# Patient Record
Sex: Female | Born: 1940
Health system: Southern US, Academic
[De-identification: ages and names within clinical notes are randomized; demographics above are authoritative.]

## PROBLEM LIST (undated history)

## (undated) ENCOUNTER — Encounter: Payer: MEDICARE | Attending: Adult Health | Primary: Adult Health

## (undated) ENCOUNTER — Encounter

## (undated) ENCOUNTER — Encounter: Attending: Adult Health | Primary: Adult Health

## (undated) ENCOUNTER — Ambulatory Visit

## (undated) ENCOUNTER — Non-Acute Institutional Stay: Payer: MEDICARE | Attending: Adult Health | Primary: Adult Health

## (undated) ENCOUNTER — Ambulatory Visit: Payer: MEDICARE

## (undated) ENCOUNTER — Telehealth: Attending: Internal Medicine | Primary: Internal Medicine

## (undated) ENCOUNTER — Encounter: Attending: Internal Medicine | Primary: Internal Medicine

## (undated) ENCOUNTER — Non-Acute Institutional Stay: Payer: MEDICARE

## (undated) ENCOUNTER — Telehealth: Attending: Hematology & Oncology | Primary: Hematology & Oncology

## (undated) ENCOUNTER — Encounter: Payer: MEDICARE | Attending: Rheumatology | Primary: Rheumatology

## (undated) ENCOUNTER — Telehealth: Attending: Rheumatology | Primary: Rheumatology

## (undated) ENCOUNTER — Inpatient Hospital Stay

## (undated) ENCOUNTER — Telehealth: Attending: Adult Health | Primary: Adult Health

## (undated) ENCOUNTER — Telehealth: Attending: Oncology | Primary: Oncology

## (undated) ENCOUNTER — Telehealth

## (undated) ENCOUNTER — Ambulatory Visit: Payer: MEDICARE | Attending: Adult Health | Primary: Adult Health

## (undated) ENCOUNTER — Encounter: Attending: Oncology | Primary: Oncology

## (undated) ENCOUNTER — Encounter: Attending: Hematology & Oncology | Primary: Hematology & Oncology

## (undated) DIAGNOSIS — I10 Essential (primary) hypertension: Secondary | ICD-10-CM

## (undated) DIAGNOSIS — M1990 Unspecified osteoarthritis, unspecified site: Secondary | ICD-10-CM

## (undated) DIAGNOSIS — E7800 Pure hypercholesterolemia, unspecified: Secondary | ICD-10-CM

---
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
## 2013-12-27 ENCOUNTER — Ambulatory Visit (HOSPITAL_COMMUNITY)
Admission: RE | Admit: 2013-12-27 | Discharge: 2013-12-27 | Disposition: A | Discharge status: Home / Self Care | Payer: Medicare Other | Source: Ambulatory Visit | Attending: Internal Medicine | Admitting: Internal Medicine

## 2013-12-27 ENCOUNTER — Other Ambulatory Visit (HOSPITAL_COMMUNITY): Payer: Self-pay | Admitting: Internal Medicine

## 2013-12-27 DIAGNOSIS — R9389 Abnormal findings on diagnostic imaging of other specified body structures: Secondary | ICD-10-CM | POA: Insufficient documentation

## 2013-12-27 DIAGNOSIS — M47817 Spondylosis without myelopathy or radiculopathy, lumbosacral region: Secondary | ICD-10-CM | POA: Insufficient documentation

## 2013-12-27 DIAGNOSIS — R1032 Left lower quadrant pain: Secondary | ICD-10-CM | POA: Insufficient documentation

## 2013-12-27 MED ORDER — IOHEXOL 300 MG/ML  SOLN
100.0000 mL | Freq: Once | INTRAMUSCULAR | Status: AC | PRN
  Administered 2013-12-27: 100 mL via INTRAVENOUS

## 2013-12-28 ENCOUNTER — Emergency Department (HOSPITAL_COMMUNITY)
Admission: EM | Admit: 2013-12-28 | Discharge: 2013-12-28 | Disposition: A | Discharge status: Home / Self Care | Payer: Medicare Other | Attending: Emergency Medicine | Admitting: Emergency Medicine

## 2013-12-28 ENCOUNTER — Emergency Department (HOSPITAL_COMMUNITY): Payer: Medicare Other

## 2013-12-28 ENCOUNTER — Encounter (HOSPITAL_COMMUNITY): Payer: Self-pay | Admitting: Emergency Medicine

## 2013-12-28 DIAGNOSIS — Z792 Long term (current) use of antibiotics: Secondary | ICD-10-CM | POA: Insufficient documentation

## 2013-12-28 DIAGNOSIS — R109 Unspecified abdominal pain: Secondary | ICD-10-CM | POA: Insufficient documentation

## 2013-12-28 DIAGNOSIS — M129 Arthropathy, unspecified: Secondary | ICD-10-CM | POA: Insufficient documentation

## 2013-12-28 DIAGNOSIS — Z79899 Other long term (current) drug therapy: Secondary | ICD-10-CM | POA: Insufficient documentation

## 2013-12-28 DIAGNOSIS — F17200 Nicotine dependence, unspecified, uncomplicated: Secondary | ICD-10-CM | POA: Insufficient documentation

## 2013-12-28 DIAGNOSIS — R300 Dysuria: Secondary | ICD-10-CM | POA: Insufficient documentation

## 2013-12-28 DIAGNOSIS — E876 Hypokalemia: Secondary | ICD-10-CM | POA: Insufficient documentation

## 2013-12-28 DIAGNOSIS — R197 Diarrhea, unspecified: Secondary | ICD-10-CM | POA: Insufficient documentation

## 2013-12-28 DIAGNOSIS — M549 Dorsalgia, unspecified: Secondary | ICD-10-CM | POA: Insufficient documentation

## 2013-12-28 DIAGNOSIS — I10 Essential (primary) hypertension: Secondary | ICD-10-CM | POA: Insufficient documentation

## 2013-12-28 DIAGNOSIS — R5381 Other malaise: Secondary | ICD-10-CM | POA: Insufficient documentation

## 2013-12-28 DIAGNOSIS — R5383 Other fatigue: Secondary | ICD-10-CM

## 2013-12-28 DIAGNOSIS — Z791 Long term (current) use of non-steroidal anti-inflammatories (NSAID): Secondary | ICD-10-CM | POA: Insufficient documentation

## 2013-12-28 DIAGNOSIS — R112 Nausea with vomiting, unspecified: Secondary | ICD-10-CM | POA: Insufficient documentation

## 2013-12-28 DIAGNOSIS — E7800 Pure hypercholesterolemia, unspecified: Secondary | ICD-10-CM | POA: Insufficient documentation

## 2013-12-28 HISTORY — DX: Essential (primary) hypertension: I10

## 2013-12-28 HISTORY — DX: Unspecified osteoarthritis, unspecified site: M19.90

## 2013-12-28 HISTORY — DX: Pure hypercholesterolemia, unspecified: E78.00

## 2013-12-28 LAB — CBC WITH DIFFERENTIAL/PLATELET
BASOS PCT: 0 % (ref 0–1)
Basophils Absolute: 0 10*3/uL (ref 0.0–0.1)
Eosinophils Absolute: 0.1 10*3/uL (ref 0.0–0.7)
Eosinophils Relative: 1 % (ref 0–5)
HCT: 28.5 % — ABNORMAL LOW (ref 36.0–46.0)
HEMOGLOBIN: 9.9 g/dL — AB (ref 12.0–15.0)
LYMPHS PCT: 8 % — AB (ref 12–46)
Lymphs Abs: 0.8 10*3/uL (ref 0.7–4.0)
MCH: 32.4 pg (ref 26.0–34.0)
MCHC: 34.7 g/dL (ref 30.0–36.0)
MCV: 93.1 fL (ref 78.0–100.0)
MONOS PCT: 8 % (ref 3–12)
Monocytes Absolute: 0.8 10*3/uL (ref 0.1–1.0)
NEUTROS ABS: 8.8 10*3/uL — AB (ref 1.7–7.7)
Neutrophils Relative %: 83 % — ABNORMAL HIGH (ref 43–77)
Platelets: 254 10*3/uL (ref 150–400)
RBC: 3.06 MIL/uL — ABNORMAL LOW (ref 3.87–5.11)
RDW: 13.7 % (ref 11.5–15.5)
WBC: 10.5 10*3/uL (ref 4.0–10.5)

## 2013-12-28 LAB — COMPREHENSIVE METABOLIC PANEL
ALT: 8 U/L (ref 0–35)
AST: 14 U/L (ref 0–37)
Albumin: 3 g/dL — ABNORMAL LOW (ref 3.5–5.2)
Alkaline Phosphatase: 53 U/L (ref 39–117)
BUN: 12 mg/dL (ref 6–23)
CO2: 23 mEq/L (ref 19–32)
CREATININE: 1.05 mg/dL (ref 0.50–1.10)
Calcium: 11.6 mg/dL — ABNORMAL HIGH (ref 8.4–10.5)
Chloride: 104 mEq/L (ref 96–112)
GFR calc non Af Amer: 52 mL/min — ABNORMAL LOW (ref >90)
GFR, EST AFRICAN AMERICAN: 60 mL/min — AB (ref >90)
GLUCOSE: 113 mg/dL — AB (ref 70–99)
POTASSIUM: 2.7 meq/L — AB (ref 3.7–5.3)
Sodium: 140 mEq/L (ref 137–147)
TOTAL PROTEIN: 9.1 g/dL — AB (ref 6.0–8.3)
Total Bilirubin: 0.3 mg/dL (ref 0.3–1.2)

## 2013-12-28 LAB — URINE MICROSCOPIC-ADD ON

## 2013-12-28 LAB — URINALYSIS, ROUTINE W REFLEX MICROSCOPIC
Bilirubin Urine: NEGATIVE
GLUCOSE, UA: NEGATIVE mg/dL
Hgb urine dipstick: NEGATIVE
Ketones, ur: NEGATIVE mg/dL
Nitrite: NEGATIVE
PH: 5.5 (ref 5.0–8.0)
Protein, ur: 30 mg/dL — AB
Urobilinogen, UA: 0.2 mg/dL (ref 0.0–1.0)

## 2013-12-28 LAB — LIPASE, BLOOD: LIPASE: 24 U/L (ref 11–59)

## 2013-12-28 MED ORDER — HYDROCODONE-ACETAMINOPHEN 5-325 MG PO TABS: 1.0000 | ORAL_TABLET | Freq: Four times a day (QID) | ORAL | Status: AC | PRN

## 2013-12-28 MED ORDER — POTASSIUM CHLORIDE ER 10 MEQ PO TBCR: 10.0000 meq | EXTENDED_RELEASE_TABLET | Freq: Two times a day (BID) | ORAL | Status: AC

## 2013-12-28 MED ORDER — TRAMADOL HCL 50 MG PO TABS: 50.0000 mg | ORAL_TABLET | Freq: Four times a day (QID) | ORAL | Status: AC | PRN

## 2013-12-28 MED ORDER — POTASSIUM CHLORIDE CRYS ER 20 MEQ PO TBCR
40.0000 meq | EXTENDED_RELEASE_TABLET | Freq: Once | ORAL | Status: AC
  Administered 2013-12-28: 40 meq via ORAL
  Filled 2013-12-28: qty 2

## 2013-12-28 MED ORDER — SODIUM CHLORIDE 0.9 % IV BOLUS (SEPSIS)
250.0000 mL | Freq: Once | INTRAVENOUS | Status: AC
  Administered 2013-12-28: 250 mL via INTRAVENOUS

## 2013-12-28 MED ORDER — SODIUM CHLORIDE 0.9 % IV SOLN
INTRAVENOUS | Status: DC
  Administered 2013-12-28: 09:00:00 via INTRAVENOUS

## 2013-12-28 MED ORDER — HYDROMORPHONE HCL PF 1 MG/ML IJ SOLN
0.5000 mg | Freq: Once | INTRAMUSCULAR | Status: AC
  Administered 2013-12-28: 0.5 mg via INTRAVENOUS
  Filled 2013-12-28: qty 1

## 2013-12-28 MED ORDER — ONDANSETRON 4 MG PO TBDP: 4.0000 mg | ORAL_TABLET | Freq: Three times a day (TID) | ORAL | Status: DC | PRN

## 2013-12-28 MED ORDER — ONDANSETRON 4 MG PO TBDP: 4.0000 mg | ORAL_TABLET | Freq: Three times a day (TID) | ORAL | Status: AC | PRN

## 2013-12-28 MED ORDER — POTASSIUM CHLORIDE 10 MEQ/100ML IV SOLN
10.0000 meq | Freq: Once | INTRAVENOUS | Status: AC
  Administered 2013-12-28: 10 meq via INTRAVENOUS
  Filled 2013-12-28: qty 100

## 2013-12-28 MED ORDER — ONDANSETRON HCL 4 MG/2ML IJ SOLN
4.0000 mg | Freq: Once | INTRAMUSCULAR | Status: AC
  Administered 2013-12-28: 4 mg via INTRAVENOUS
  Filled 2013-12-28: qty 2

## 2013-12-28 NOTE — ED Notes (Signed)
Lab called critical K 2.7.  Notified edp 

## 2013-12-28 NOTE — ED Notes (Signed)
Pt requesting more pain medication.  Pt reports pain 8/10.  Notified edp

## 2013-12-28 NOTE — ED Provider Notes (Signed)
CSN: [id number]     Arrival date & time 12/28/13  4098 History   First MD Initiated Contact with Patient 12/28/13 0740     Chief Complaint  Patient presents with  . Abdominal Pain  . Back Pain     (Consider location/radiation/quality/duration/timing/severity/associated sxs/prior Treatment) Patient is a 73 y.o. female presenting with abdominal pain and back pain. The history is provided by the patient.  Abdominal Pain Associated symptoms: diarrhea, dysuria, fatigue, nausea and vomiting   Associated symptoms: no fever and no shortness of breath   Back Pain Associated symptoms: abdominal pain and dysuria   Associated symptoms: no fever and no headaches    patient brought in by her daughter. Patient with complaint of lower abdominal pain nausea vomiting and diarrhea. However is only vomited may be of once or twice a day and and really mostly loose bowel movements. No blood in either. Patient saw her primary care Dr. in the [place] area of last week and was started on 2 antibiotics Cipro and Flagyl patient is still taking those. Patient had CT scan of her abdomen yesterday done as an outpatient they do not know the results. This was done here. Patient also with a complaint of back pain and stiffness made worse with moving. No injury. Patient's past medical history significant for hypertension and arthritis. Patient states that the abdominal pain is 8/10 in the back pain is 8/10. Both started around the same time.  Past Medical History  Diagnosis Date  . Hypertension   . Arthritis   . High cholesterol    History reviewed. No pertinent past surgical history. Family History  Problem Relation Age of Onset  . Cancer Sister   . Cancer Brother    History  Substance Use Topics  . Smoking status: Current Every Day Smoker -- 0.50 packs/day for 58 years    Types: Cigarettes  . Smokeless tobacco: Never Used  . Alcohol Use: No   OB History   Grav Para Term Preterm Abortions TAB SAB Ect Mult  Living                 Review of Systems  Constitutional: Positive for fatigue. Negative for fever.  HENT: Negative for congestion.   Eyes: Negative for redness.  Respiratory: Negative for shortness of breath.   Gastrointestinal: Positive for nausea, vomiting, abdominal pain and diarrhea.  Genitourinary: Positive for dysuria.  Musculoskeletal: Positive for back pain.  Skin: Negative for rash.  Neurological: Negative for headaches.  Hematological: Does not bruise/bleed easily.  Psychiatric/Behavioral: Negative for confusion.      Allergies  Review of patient's allergies indicates no known allergies.  Home Medications   Current Outpatient Rx  Name  Route  Sig  Dispense  Refill  . ciprofloxacin (CIPRO) 500 MG tablet   Oral   Take 1 tablet by mouth 2 (two) times daily. For 7 days         . cloNIDine (CATAPRES) 0.2 MG tablet   Oral   Take 1 tablet by mouth daily.         . fenofibrate micronized (LOFIBRA) 67 MG capsule   Oral   Take 1 capsule by mouth daily.         Marland Kitchen ibuprofen (ADVIL,MOTRIN) 600 MG tablet   Oral   Take 1 tablet by mouth 2 (two) times daily.         . metroNIDAZOLE (FLAGYL) 500 MG tablet   Oral   Take 1 tablet by mouth 3 (three) times  daily. For 7 days         . potassium chloride (K-DUR) 10 MEQ tablet   Oral   Take 1 tablet by mouth 2 (two) times daily.         . sertraline (ZOLOFT) 50 MG tablet   Oral   Take 1 tablet by mouth 2 (two) times daily.         . valsartan-hydrochlorothiazide (DIOVAN-HCT) 320-12.5 MG per tablet   Oral   Take 1 tablet by mouth daily.         . verapamil (CALAN-SR) 240 MG CR tablet   Oral   Take 1 tablet by mouth daily.         Marland Kitchen HYDROcodone-acetaminophen (NORCO/VICODIN) 5-325 MG per tablet   Oral   Take 1-2 tablets by mouth every 6 (six) hours as needed for moderate pain.   15 tablet   0   . potassium chloride (K-DUR) 10 MEQ tablet   Oral   Take 1 tablet (10 mEq total) by mouth 2 (two)  times daily.   6 tablet   0   . traMADol (ULTRAM) 50 MG tablet   Oral   Take 1 tablet (50 mg total) by mouth every 6 (six) hours as needed.   20 tablet   0    BP 148/63  Pulse 63  Temp(Src) 98.5 F (36.9 C) (Oral)  Resp 18  Ht 5\' 7"  (1.702 m)  Wt 140 lb (63.504 kg)  BMI 21.92 kg/m2  SpO2 93% Physical Exam  Nursing note and vitals reviewed. Constitutional: She is oriented to person, place, and time. She appears well-developed and well-nourished. No distress.  HENT:  Head: Normocephalic and atraumatic.  Mouth/Throat: Oropharynx is clear and moist.  Eyes: Conjunctivae and EOM are normal. Pupils are equal, round, and reactive to light.  Neck: Normal range of motion.  Cardiovascular: Normal rate, regular rhythm and normal heart sounds.   No murmur heard. Pulmonary/Chest: Effort normal and breath sounds normal.  Abdominal: Soft. Bowel sounds are normal. There is no tenderness.  Musculoskeletal: Normal range of motion.  Neurological: She is alert and oriented to person, place, and time. No cranial nerve deficit. She exhibits normal muscle tone. Coordination normal.  Skin: Skin is warm. No rash noted.    ED Course  Procedures (including critical care time) Labs Review Labs Reviewed  URINALYSIS, ROUTINE W REFLEX MICROSCOPIC - Abnormal; Notable for the following:    Specific Gravity, Urine >1.030 (*)    Protein, ur 30 (*)    Leukocytes, UA TRACE (*)    All other components within normal limits  COMPREHENSIVE METABOLIC PANEL - Abnormal; Notable for the following:    Potassium 2.7 (*)    Glucose, Bld 113 (*)    Calcium 11.6 (*)    Total Protein 9.1 (*)    Albumin 3.0 (*)    GFR calc non Af Amer 52 (*)    GFR calc Af Amer 60 (*)    All other components within normal limits  CBC WITH DIFFERENTIAL - Abnormal; Notable for the following:    RBC 3.06 (*)    Hemoglobin 9.9 (*)    HCT 28.5 (*)    Neutrophils Relative % 83 (*)    Neutro Abs 8.8 (*)    Lymphocytes Relative 8  (*)    All other components within normal limits  URINE MICROSCOPIC-ADD ON - Abnormal; Notable for the following:    Casts GRANULAR CAST (*)    All other components within  normal limits  LIPASE, BLOOD   Results for orders placed during the hospital encounter of 12/28/13  URINALYSIS, ROUTINE W REFLEX MICROSCOPIC      Result Value Ref Range   Color, Urine YELLOW  YELLOW   APPearance CLEAR  CLEAR   Specific Gravity, Urine >1.030 (*) 1.005 - 1.030   pH 5.5  5.0 - 8.0   Glucose, UA NEGATIVE  NEGATIVE mg/dL   Hgb urine dipstick NEGATIVE  NEGATIVE   Bilirubin Urine NEGATIVE  NEGATIVE   Ketones, ur NEGATIVE  NEGATIVE mg/dL   Protein, ur 30 (*) NEGATIVE mg/dL   Urobilinogen, UA 0.2  0.0 - 1.0 mg/dL   Nitrite NEGATIVE  NEGATIVE   Leukocytes, UA TRACE (*) NEGATIVE  COMPREHENSIVE METABOLIC PANEL      Result Value Ref Range   Sodium 140  137 - 147 mEq/L   Potassium 2.7 (*) 3.7 - 5.3 mEq/L   Chloride 104  96 - 112 mEq/L   CO2 23  19 - 32 mEq/L   Glucose, Bld 113 (*) 70 - 99 mg/dL   BUN 12  6 - 23 mg/dL   Creatinine, Ser 0.98  0.50 - 1.10 mg/dL   Calcium 11.9 (*) 8.4 - 10.5 mg/dL   Total Protein 9.1 (*) 6.0 - 8.3 g/dL   Albumin 3.0 (*) 3.5 - 5.2 g/dL   AST 14  0 - 37 U/L   ALT 8  0 - 35 U/L   Alkaline Phosphatase 53  39 - 117 U/L   Total Bilirubin 0.3  0.3 - 1.2 mg/dL   GFR calc non Af Amer 52 (*) >90 mL/min   GFR calc Af Amer 60 (*) >90 mL/min  LIPASE, BLOOD      Result Value Ref Range   Lipase 24  11 - 59 U/L  CBC WITH DIFFERENTIAL      Result Value Ref Range   WBC 10.5  4.0 - 10.5 K/uL   RBC 3.06 (*) 3.87 - 5.11 MIL/uL   Hemoglobin 9.9 (*) 12.0 - 15.0 g/dL   HCT 14.7 (*) 82.9 - 56.2 %   MCV 93.1  78.0 - 100.0 fL   MCH 32.4  26.0 - 34.0 pg   MCHC 34.7  30.0 - 36.0 g/dL   RDW 13.0  86.5 - 78.4 %   Platelets 254  150 - 400 K/uL   Neutrophils Relative % 83 (*) 43 - 77 %   Neutro Abs 8.8 (*) 1.7 - 7.7 K/uL   Lymphocytes Relative 8 (*) 12 - 46 %   Lymphs Abs 0.8  0.7 - 4.0 K/uL    Monocytes Relative 8  3 - 12 %   Monocytes Absolute 0.8  0.1 - 1.0 K/uL   Eosinophils Relative 1  0 - 5 %   Eosinophils Absolute 0.1  0.0 - 0.7 K/uL   Basophils Relative 0  0 - 1 %   Basophils Absolute 0.0  0.0 - 0.1 K/uL  URINE MICROSCOPIC-ADD ON      Result Value Ref Range   WBC, UA 3-6  <3 WBC/hpf   Casts GRANULAR CAST (*) NEGATIVE    Imaging Review Dg Chest 2 View  12/28/2013   CLINICAL DATA Pain and hypertension  EXAM CHEST  2 VIEW  COMPARISON None.  FINDINGS There is minimal scarring in the left base. Lungs are otherwise clear. Heart is mildly enlarged with normal pulmonary vascularity. No adenopathy. No bone lesions.  IMPRESSION No edema or consolidation. Mild scarring left base. Heart  mildly enlarged.  SIGNATURE  Electronically Signed   By: Bretta BangWilliam  Woodruff M.D.   On: 12/28/2013 09:29   Ct Abdomen Pelvis W Contrast  12/27/2013   CLINICAL DATA:  Left lower quadrant pain for 3 weeks. Question ascites.  EXAM: CT ABDOMEN AND PELVIS WITH CONTRAST  TECHNIQUE: Multidetector CT imaging of the abdomen and pelvis was performed using the standard protocol following bolus administration of intravenous contrast.  CONTRAST:  100mL OMNIPAQUE IOHEXOL 300 MG/ML  SOLN  COMPARISON:  None.  FINDINGS: Lower Chest: Mild scarring at the left lung base. Moderate cardiomegaly, without pericardial or pleural effusion.  Abdomen/Pelvis: Normal all liver, spleen, stomach, pancreas, gallbladder, biliary tract, adrenal glands. Right renal cysts. Subtle heterogeneous enhancement of the left kidney suspected. Example images 18 and 20 of delayed series 7.  Aortic and branch vessel atherosclerosis. No retroperitoneal or retrocrural adenopathy. Scattered colonic diverticula. Normal terminal ileum. Minimal motion degradation. Normal small bowel without abdominal ascites. No pelvic adenopathy. Decompressed urinary bladder. Mild pelvic floor laxity. Uterine calcifications which could represent small underlying fibroids or be  dystrophic. An exophytic fibroid is identified at the fundus at 8 mm on sagittal image 53.  No adnexal mass or significant free fluid.  Bones/Musculoskeletal: Mild osteopenia. A mild to moderate inferior endplate compression deformity at L1. Minimal ventral canal encroachment inferiorly. Degenerative disc disease at L3-4. Multilevel disc bulges.  IMPRESSION: 1. Equivocal heterogeneous enhancement involving the left kidney on delayed images. Cannot exclude Mild pyelonephritis. Correlate with urinalysis. 2. Osteopenia with spondylosis and a mild to moderate L1 compression deformity. 3. No other explanation for left-sided pain. 4. Small volume uterine fibroid or fibroids. 5. Pelvic floor laxity. 6. Mild motion degradation.   Electronically Signed   By: Jeronimo GreavesKyle  Talbot M.D.   On: 12/27/2013 12:03     EKG Interpretation   Date/Time:  Wednesday December 28 2013 09:50:00 EDT Ventricular Rate:  63 PR Interval:  178 QRS Duration: 106 QT Interval:  440 QTC Calculation: 450 R Axis:   -20 Text Interpretation:  Normal sinus rhythm Moderate voltage criteria for  LVH, may be normal variant Nonspecific T wave abnormality Abnormal ECG No  previous ECGs available Confirmed by Ayo Smoak  MD, Herman Fiero ([id number]) on  12/28/2013 10:09:22 AM      MDM   Final diagnoses:  Abdominal pain  Hypokalemia  Back pain    Patient already on Cipro and Flagyl started by her primary care Dr. presumably for her. Treatment for possible diverticulitis. CT scan negative. Only significant finding here today was the finding of hypokalemia. Patient treated with a total of 20 mEq of potassium IV. Also given 40 mEq of potassium by mouth. Patient will continue potassium supplements for the next 3 days. Patient has followup with her doctor on Friday potassium can be rechecked. No distinct explanation for the abdominal pain. No evidence of diverticulitis. No evidence of any other acute intra-abdominal process. We'll treat with pain medicine. Also  we'll treat with antinausea medicine. Suspect that the back pain complaint is of musculoskeletal in nature.    Shelda JakesScott W. Greenlee Ancheta, MD 12/28/13 [phone]

## 2013-12-28 NOTE — ED Notes (Signed)
Patient c/o lower abd pain with nausea vomiting, and diarrhea. Per family member patient seen by PCP last week and given 2 antibiotics (unsure of the names or diagnosis). Per family member patient had CT of abd and pelvis. Patient denies any improvement. Patient also c/o lower back pain.

## 2013-12-28 NOTE — Discharge Instructions (Signed)
Followup with your Dr. in [place] is scheduled for Friday. He'll be important to have your potassium rechecked. Take the tramadol as needed for pain. Can supplement with hydrocodone as needed for more severe pain. Take the Zofran as needed for any nausea or are vomiting. Your potassium was low today. You need to continue taking potassium pills for the next 3 days. Return for any new or worse symptoms.

## 2015-09-09 IMAGING — CT CT ABD-PELV W/ CM
2 of 4 series · 16 of 46 positions shown, 18 images · IV contrast (Omnipaque 300)
Comparison: None.

CLINICAL DATA: Left lower quadrant pain for 3 weeks. Question
ascites.

EXAM:
CT ABDOMEN AND PELVIS WITH CONTRAST
TECHNIQUE: Multidetector CT imaging of the abdomen and pelvis was performed
using the standard protocol following bolus administration of
intravenous contrast.
CONTRAST:  100mL OMNIPAQUE IOHEXOL 300 MG/ML  SOLN

[Series 2: abd_pel_with 5.0 b40f · axial · 0.66mm/px · z∈[-408,-28]mm · 13 of 84 slices shown, 15 images]
[im 4/84  soft-tissue]
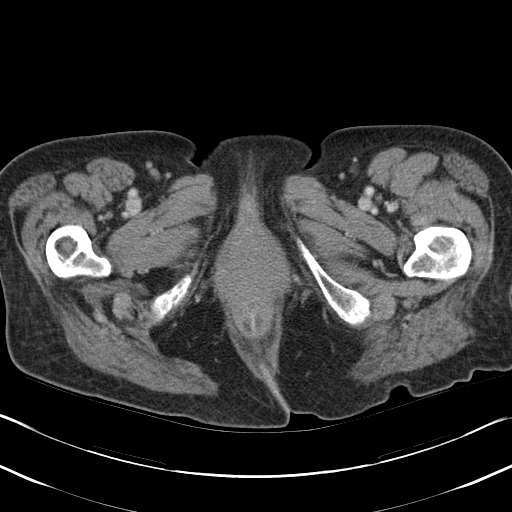
[im 4/84  bone]
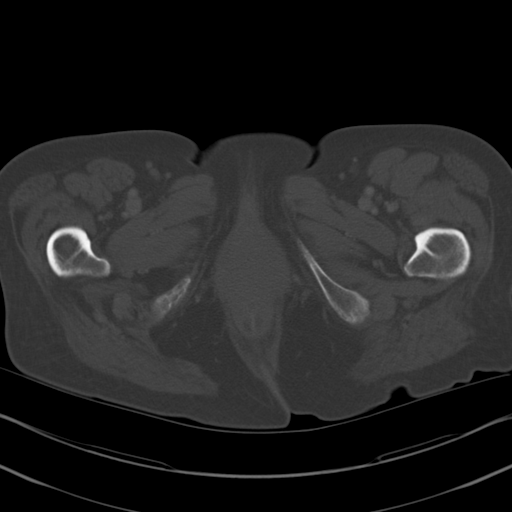
[im 12/84  soft-tissue]
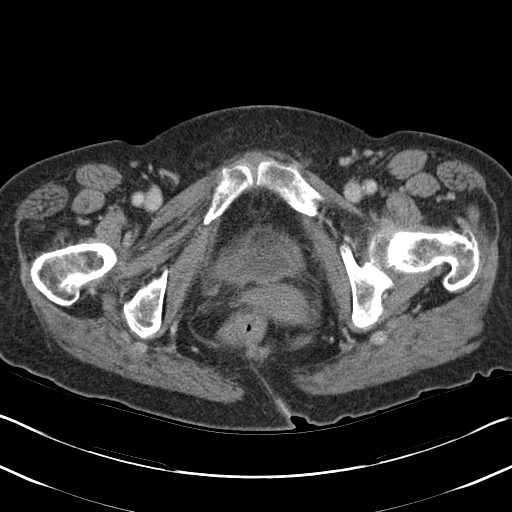
[im 16/84  soft-tissue]
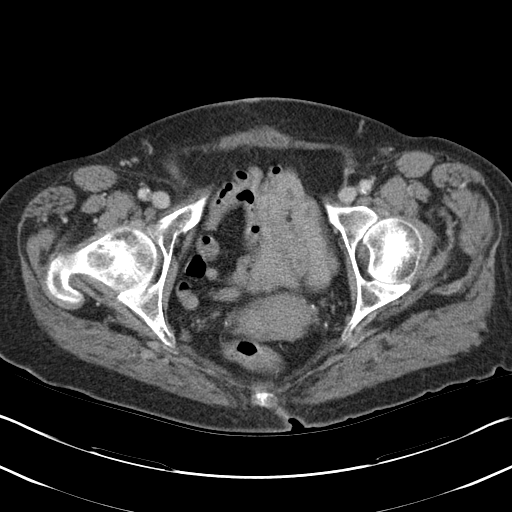
[im 24/84  soft-tissue]
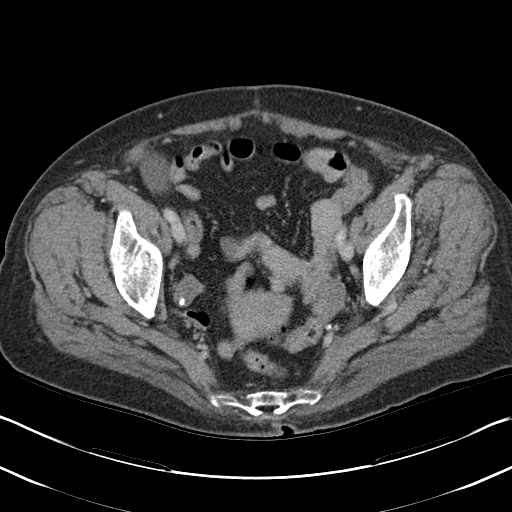
[im 28/84  soft-tissue]
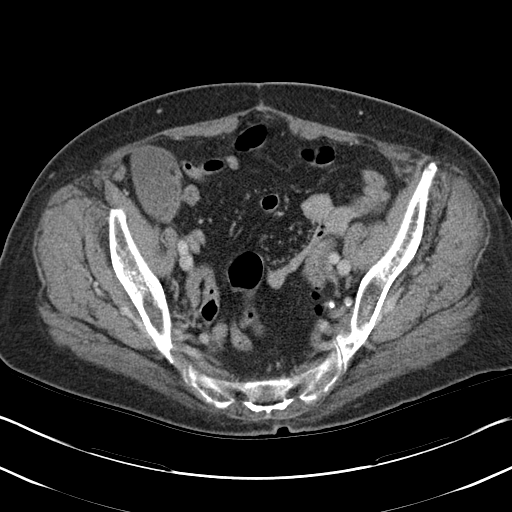
[im 36/84  soft-tissue]
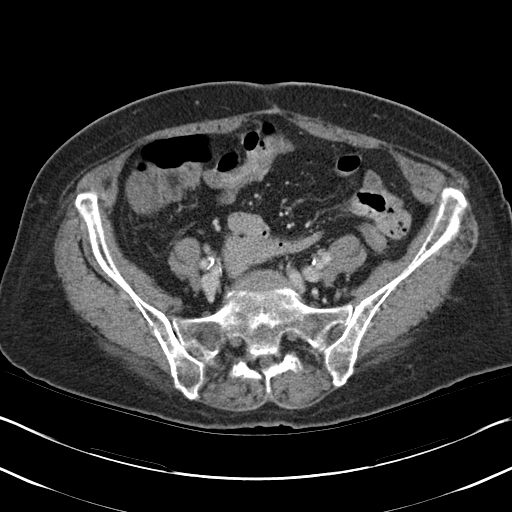
[im 44/84  soft-tissue]
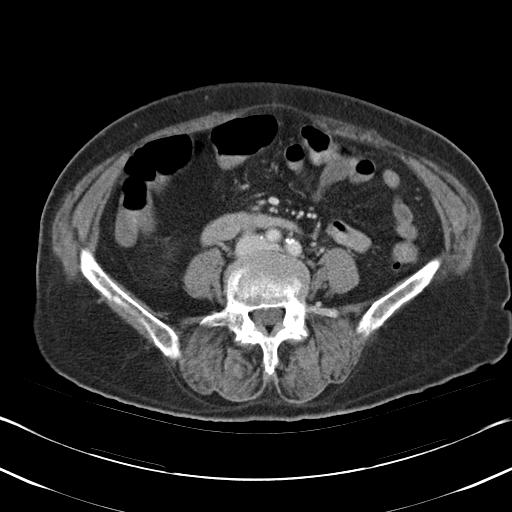
[im 48/84  soft-tissue]
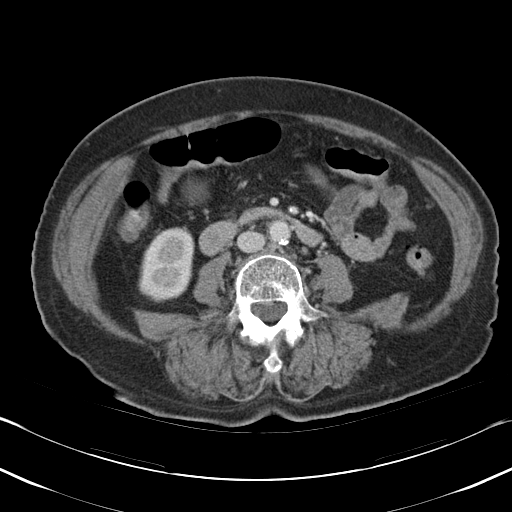
[im 56/84  soft-tissue]
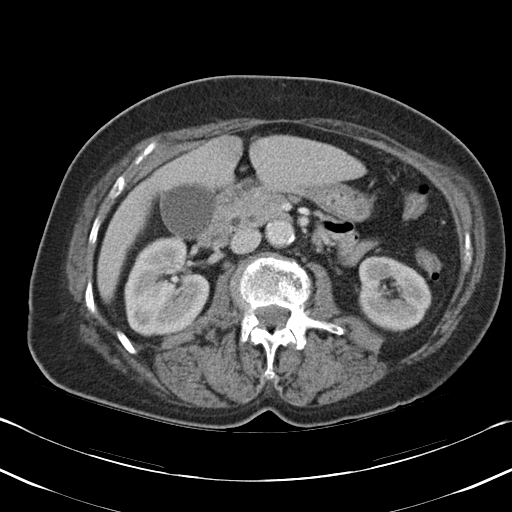
[im 56/84  bone]
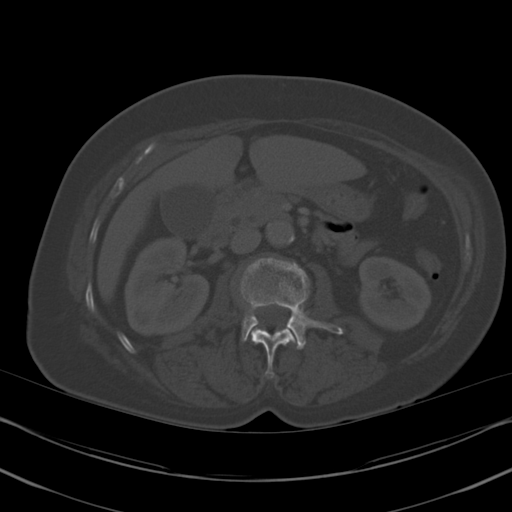
[im 60/84  soft-tissue]
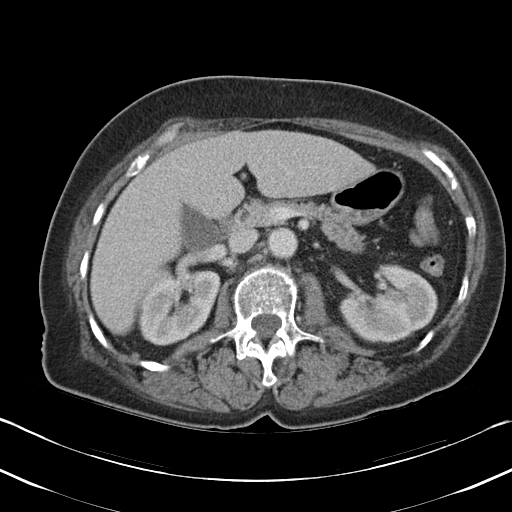
[im 68/84  soft-tissue]
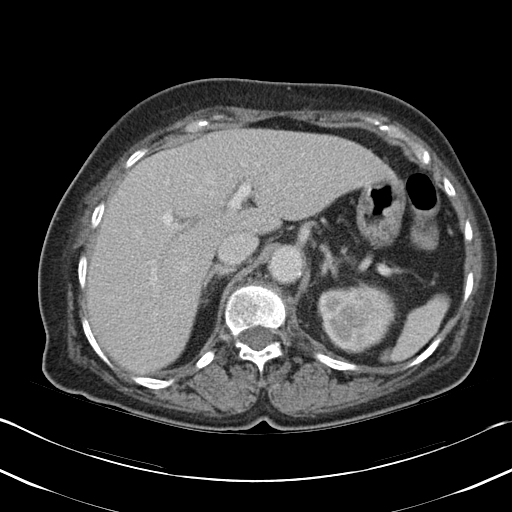
[im 72/84  soft-tissue]
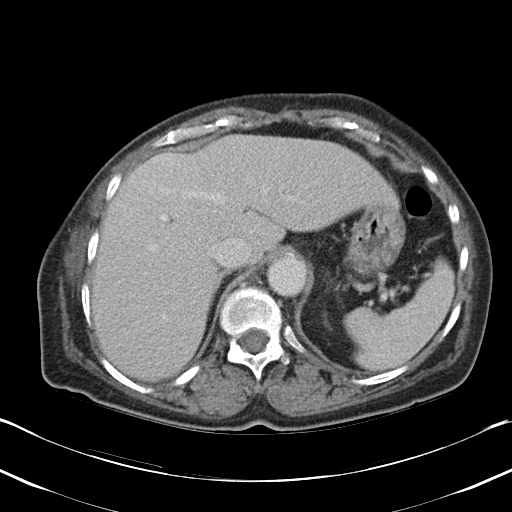
[im 80/84  soft-tissue]
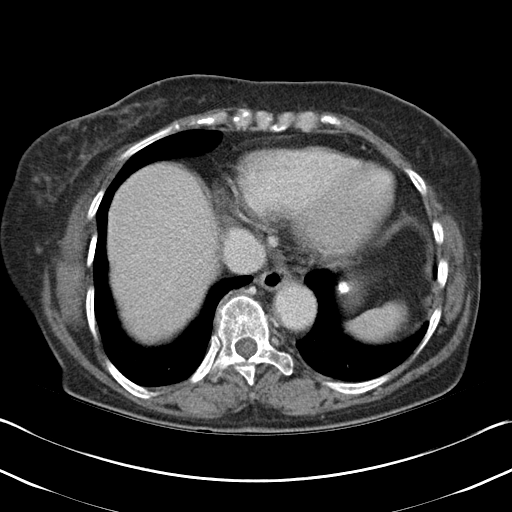

[Series 4: abd_pel_with 3.0 spo cor · coronal · 0.64mm/px · 3 of 76 slices shown]
[im 26/76  soft-tissue]
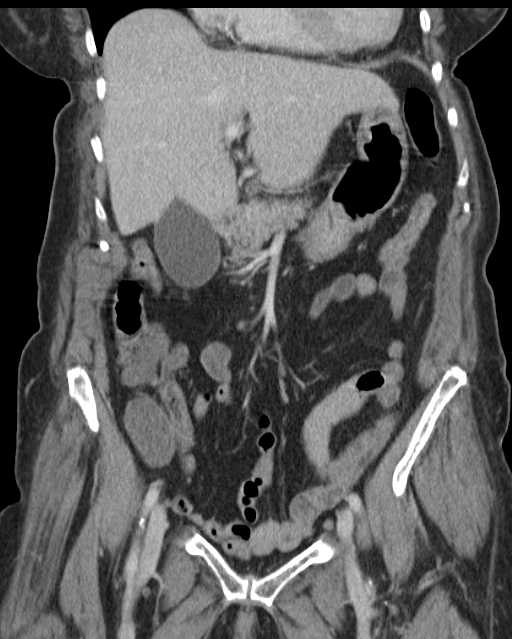
[im 34/76  soft-tissue]
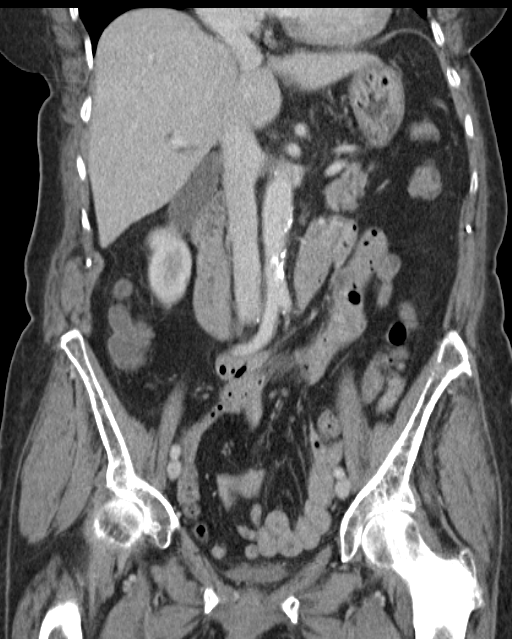
[im 42/76  soft-tissue]
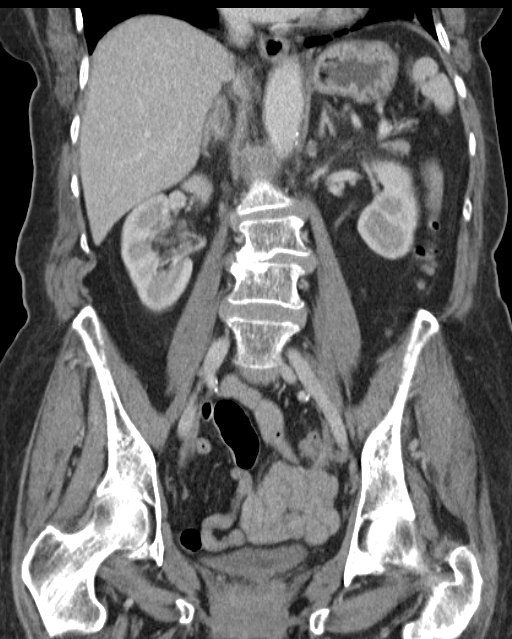

[16 of 46 positions shown; findings below may reference images not displayed]

FINDINGS: Lower Chest: Mild scarring at the left lung base. Moderate
cardiomegaly, without pericardial or pleural effusion.

Abdomen/Pelvis: Normal all liver, spleen, stomach, pancreas,
gallbladder, biliary tract, adrenal glands. Right renal cysts.
Subtle heterogeneous enhancement of the left kidney suspected.
Example images 18 and 20 of delayed series 7.

Aortic and branch vessel atherosclerosis. No retroperitoneal or
retrocrural adenopathy. Scattered colonic diverticula. Normal
terminal ileum. Minimal motion degradation. Normal small bowel
without abdominal ascites. No pelvic adenopathy. Decompressed
urinary bladder. Mild pelvic floor laxity. Uterine calcifications
which could represent small underlying fibroids or be dystrophic. An
exophytic fibroid is identified at the fundus at 8 mm on sagittal
image 53.

No adnexal mass or significant free fluid.

Bones/Musculoskeletal: Mild osteopenia. A mild to moderate inferior
endplate compression deformity at L1. Minimal ventral canal
encroachment inferiorly. Degenerative disc disease at L3-4.
Multilevel disc bulges.
IMPRESSION: 1. Equivocal heterogeneous enhancement involving the left kidney on
delayed images. Cannot exclude Mild pyelonephritis. Correlate with
urinalysis.
2. Osteopenia with spondylosis and a mild to moderate L1 compression
deformity.
3. No other explanation for left-sided pain.
4. Small volume uterine fibroid or fibroids.
5. Pelvic floor laxity.
6. Mild motion degradation.

## 2015-09-10 IMAGING — CR DG CHEST 2V
2 series · 2 of 2 positions shown · non-contrast
Comparison: none

[view not recorded (1 of 2)]
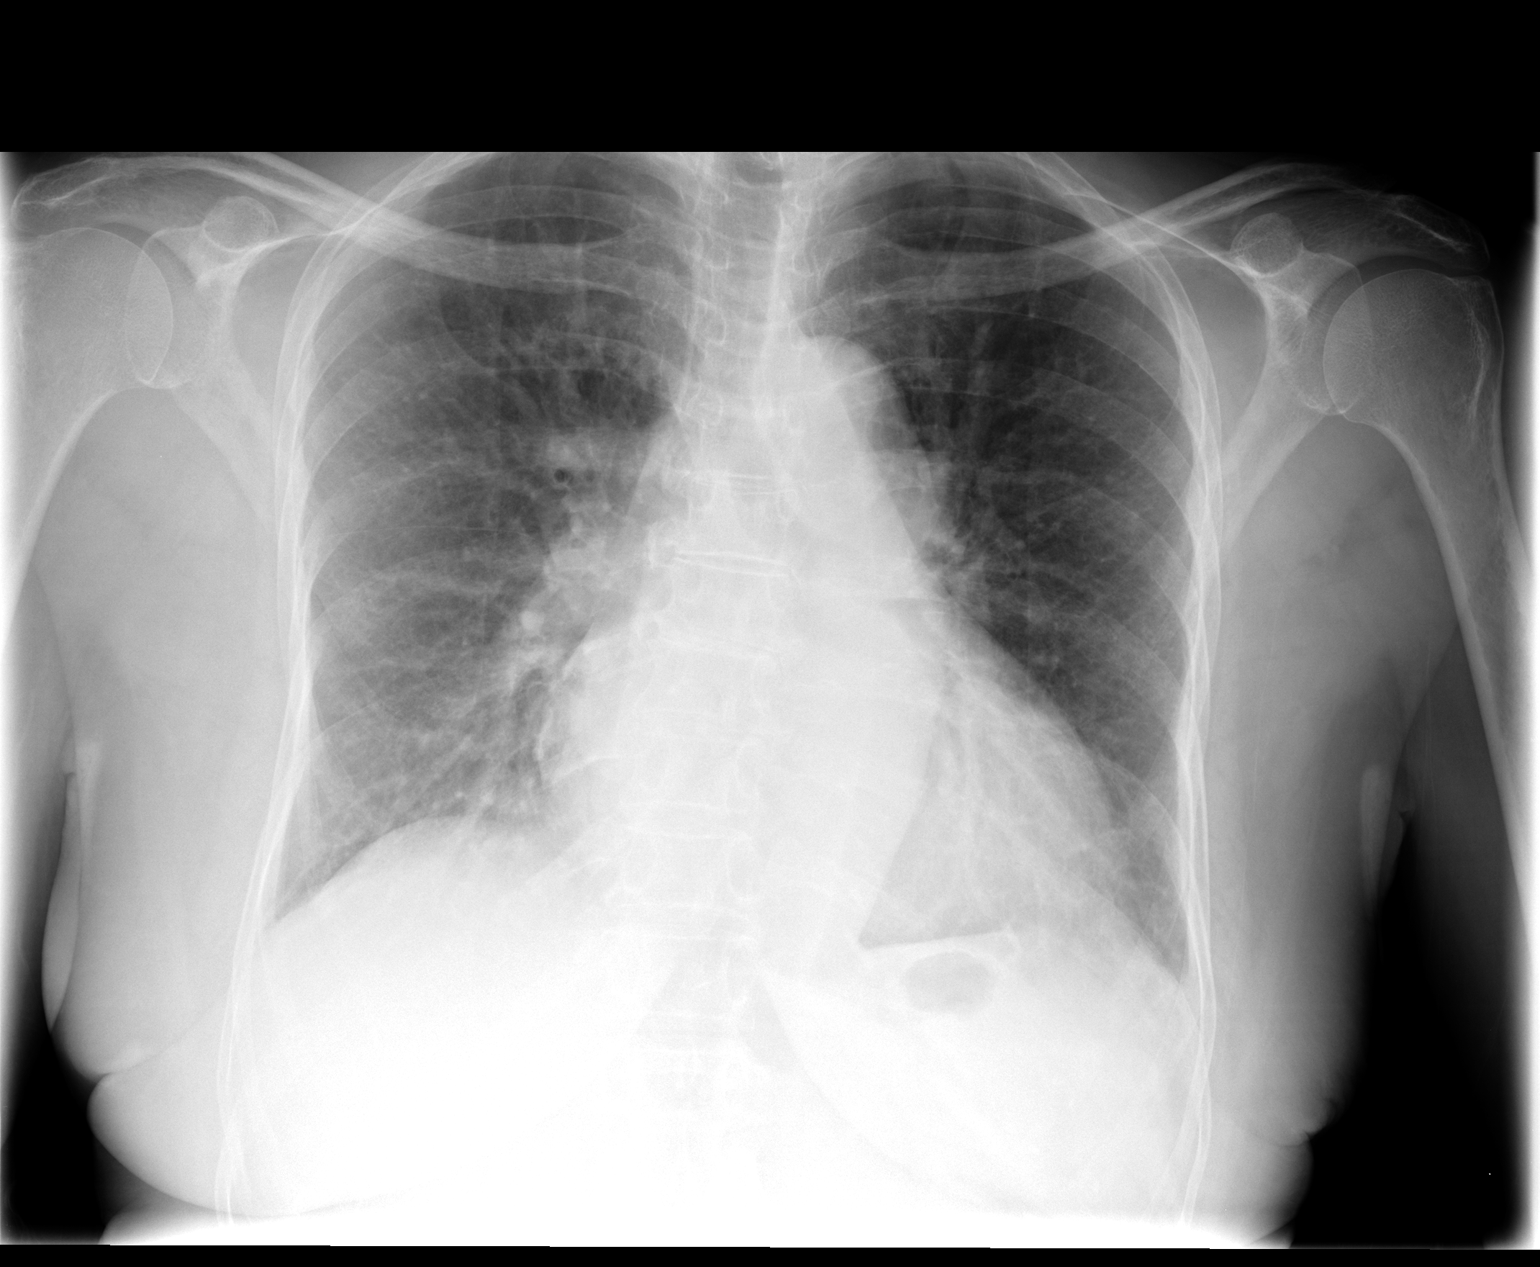

[view not recorded (2 of 2)]
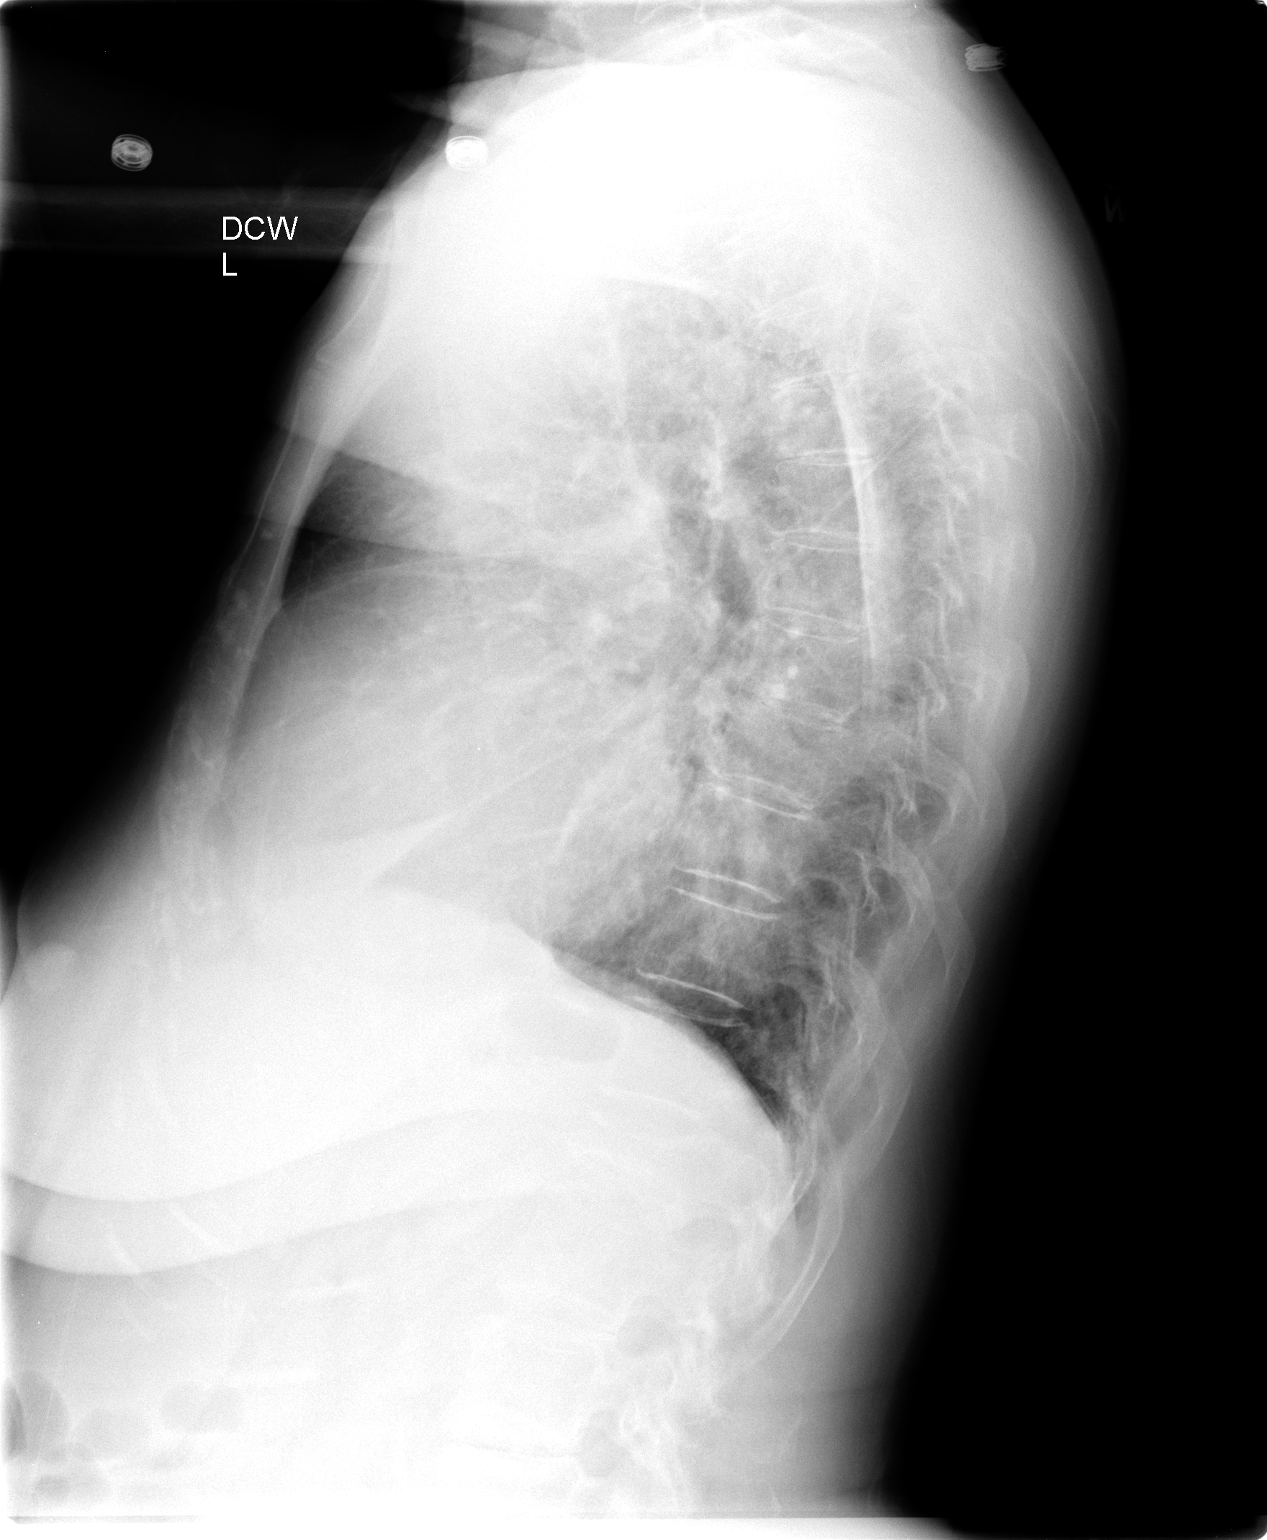

[2 of 2 positions shown; findings below may reference images not displayed]

CLINICAL DATA
Pain and hypertension

EXAM
CHEST  2 VIEW

COMPARISON
None.

FINDINGS
There is minimal scarring in the left base. Lungs are otherwise
clear. Heart is mildly enlarged with normal pulmonary vascularity.
No adenopathy. No bone lesions.

IMPRESSION
No edema or consolidation. Mild scarring left base. Heart mildly
enlarged.

SIGNATURE

## 2017-04-28 MED ORDER — POMALYST 2 MG CAPSULE
ORAL_CAPSULE | Freq: Every day | ORAL | 0 refills | 0.00000 days | Status: CP
Start: 2017-04-28 — End: 2017-06-02

## 2017-05-06 ENCOUNTER — Ambulatory Visit
Admission: RE | Admit: 2017-05-06 | Discharge: 2017-05-06 | Disposition: A | Discharge status: Home / Self Care | Payer: MEDICARE

## 2017-05-06 ENCOUNTER — Ambulatory Visit
Admission: RE | Admit: 2017-05-06 | Discharge: 2017-05-06 | Disposition: A | Discharge status: Home / Self Care | Payer: MEDICARE | Attending: Adult Health | Admitting: Adult Health

## 2017-05-06 DIAGNOSIS — C9002 Multiple myeloma in relapse: Principal | ICD-10-CM

## 2017-05-06 DIAGNOSIS — C9000 Multiple myeloma not having achieved remission: Principal | ICD-10-CM

## 2017-05-14 MED ORDER — DEXAMETHASONE 4 MG TABLET
ORAL_TABLET | 1 refills | 0 days | Status: CP
Start: 2017-05-14 — End: 2017-07-10

## 2017-06-02 MED ORDER — POMALYST 2 MG CAPSULE
ORAL_CAPSULE | Freq: Every day | ORAL | 0 refills | 0 days | Status: CP
Start: 2017-06-02 — End: 2017-06-25

## 2017-06-03 ENCOUNTER — Ambulatory Visit
Admission: RE | Admit: 2017-06-03 | Discharge: 2017-06-03 | Disposition: A | Discharge status: Home / Self Care | Payer: MEDICARE

## 2017-06-03 ENCOUNTER — Ambulatory Visit
Admission: RE | Admit: 2017-06-03 | Discharge: 2017-06-03 | Disposition: A | Discharge status: Home / Self Care | Attending: Adult Health | Admitting: Adult Health

## 2017-06-03 DIAGNOSIS — G620 Drug-induced polyneuropathy: Secondary | ICD-10-CM

## 2017-06-03 DIAGNOSIS — T451X5A Adverse effect of antineoplastic and immunosuppressive drugs, initial encounter: Secondary | ICD-10-CM

## 2017-06-03 DIAGNOSIS — C9002 Multiple myeloma in relapse: Principal | ICD-10-CM

## 2017-06-03 DIAGNOSIS — C9000 Multiple myeloma not having achieved remission: Secondary | ICD-10-CM

## 2017-06-03 MED ORDER — MAGNESIUM OXIDE 400 MG (241.3 MG MAGNESIUM) TABLET
ORAL_TABLET | Freq: Two times a day (BID) | ORAL | 2 refills | 0 days | Status: CP
Start: 2017-06-03 — End: 2018-02-24

## 2017-06-25 MED ORDER — POMALYST 2 MG CAPSULE
ORAL_CAPSULE | Freq: Every day | ORAL | 0 refills | 0 days | Status: CP
Start: 2017-06-25 — End: 2017-07-28

## 2017-07-01 ENCOUNTER — Ambulatory Visit
Admission: RE | Admit: 2017-07-01 | Discharge: 2017-07-01 | Disposition: A | Discharge status: Home / Self Care | Attending: Adult Health | Admitting: Adult Health

## 2017-07-01 ENCOUNTER — Ambulatory Visit: Admission: RE | Admit: 2017-07-01 | Discharge: 2017-07-01 | Disposition: A | Discharge status: Home / Self Care

## 2017-07-01 DIAGNOSIS — C9002 Multiple myeloma in relapse: Principal | ICD-10-CM

## 2017-07-01 DIAGNOSIS — R630 Anorexia: Secondary | ICD-10-CM

## 2017-07-01 DIAGNOSIS — G4700 Insomnia, unspecified: Secondary | ICD-10-CM

## 2017-07-01 DIAGNOSIS — C9000 Multiple myeloma not having achieved remission: Secondary | ICD-10-CM

## 2017-07-01 DIAGNOSIS — R432 Parageusia: Principal | ICD-10-CM

## 2017-07-01 MED ORDER — ZINC GLUCONATE 100 MG TABLET
Freq: Every day | ORAL | 11 refills | 0 days | Status: CP
Start: 2017-07-01 — End: 2018-04-21

## 2017-07-01 MED ORDER — MIRTAZAPINE 45 MG TABLET
ORAL_TABLET | Freq: Every evening | ORAL | 6 refills | 0.00000 days | Status: CP
Start: 2017-07-01 — End: 2018-02-23

## 2017-07-10 MED ORDER — DEXAMETHASONE 4 MG TABLET
ORAL_TABLET | 2 refills | 0 days | Status: CP
Start: 2017-07-10 — End: 2017-11-03

## 2017-07-28 MED ORDER — POMALYST 2 MG CAPSULE
ORAL_CAPSULE | Freq: Every day | ORAL | 0 refills | 0 days | Status: CP
Start: 2017-07-28 — End: 2017-08-20

## 2017-07-29 ENCOUNTER — Ambulatory Visit
Admission: RE | Admit: 2017-07-29 | Discharge: 2017-07-29 | Disposition: A | Discharge status: Home / Self Care | Payer: MEDICARE | Attending: Oncology | Admitting: Oncology

## 2017-07-29 ENCOUNTER — Ambulatory Visit
Admission: RE | Admit: 2017-07-29 | Discharge: 2017-07-29 | Disposition: A | Discharge status: Home / Self Care | Payer: MEDICARE

## 2017-07-29 ENCOUNTER — Ambulatory Visit: Admission: RE | Admit: 2017-07-29 | Discharge: 2017-07-29 | Disposition: A | Discharge status: Home / Self Care

## 2017-07-29 DIAGNOSIS — C9000 Multiple myeloma not having achieved remission: Principal | ICD-10-CM

## 2017-07-29 DIAGNOSIS — C9002 Multiple myeloma in relapse: Principal | ICD-10-CM

## 2017-08-17 MED ORDER — POTASSIUM CHLORIDE ER 10 MEQ TABLET, EXTENDED RELEASE WRAPPER
ORAL_TABLET | Freq: Every day | ORAL | 11 refills | 0 days | Status: CP
Start: 2017-08-17 — End: 2017-08-18

## 2017-08-18 MED ORDER — POTASSIUM CHLORIDE ER 10 MEQ TABLET, EXTENDED RELEASE WRAPPER
ORAL_TABLET | Freq: Every day | ORAL | 11 refills | 0.00000 days | Status: CP
Start: 2017-08-18 — End: 2018-04-21

## 2017-08-20 MED ORDER — POMALYST 2 MG CAPSULE
ORAL_CAPSULE | Freq: Every day | ORAL | 0 refills | 0 days | Status: CP
Start: 2017-08-20 — End: 2017-09-14

## 2017-08-24 ENCOUNTER — Ambulatory Visit
Admission: RE | Admit: 2017-08-24 | Discharge: 2017-08-24 | Disposition: A | Discharge status: Home / Self Care | Payer: MEDICARE

## 2017-08-24 ENCOUNTER — Ambulatory Visit: Admission: RE | Admit: 2017-08-24 | Discharge: 2017-08-24 | Disposition: A | Discharge status: Home / Self Care

## 2017-08-24 ENCOUNTER — Ambulatory Visit
Admission: RE | Admit: 2017-08-24 | Discharge: 2017-08-24 | Disposition: A | Discharge status: Home / Self Care | Payer: MEDICARE | Attending: Adult Health | Admitting: Adult Health

## 2017-08-24 DIAGNOSIS — G620 Drug-induced polyneuropathy: Secondary | ICD-10-CM

## 2017-08-24 DIAGNOSIS — C9000 Multiple myeloma not having achieved remission: Secondary | ICD-10-CM

## 2017-08-24 DIAGNOSIS — Z23 Encounter for immunization: Secondary | ICD-10-CM

## 2017-08-24 DIAGNOSIS — C9002 Multiple myeloma in relapse: Principal | ICD-10-CM

## 2017-08-24 DIAGNOSIS — T451X5A Adverse effect of antineoplastic and immunosuppressive drugs, initial encounter: Secondary | ICD-10-CM

## 2017-08-24 DIAGNOSIS — N183 Chronic kidney disease, stage 3 (moderate): Secondary | ICD-10-CM

## 2017-09-14 MED ORDER — POMALYST 2 MG CAPSULE
ORAL_CAPSULE | Freq: Every day | ORAL | 0 refills | 0 days | Status: CP
Start: 2017-09-14 — End: 2017-10-16

## 2017-09-25 ENCOUNTER — Ambulatory Visit
Admission: RE | Admit: 2017-09-25 | Discharge: 2017-09-25 | Disposition: A | Discharge status: Home / Self Care | Payer: MEDICARE | Attending: Adult Health | Admitting: Adult Health

## 2017-09-25 ENCOUNTER — Ambulatory Visit
Admission: RE | Admit: 2017-09-25 | Discharge: 2017-09-25 | Disposition: A | Discharge status: Home / Self Care | Payer: MEDICARE

## 2017-09-25 DIAGNOSIS — N183 Chronic kidney disease, stage 3 (moderate): Secondary | ICD-10-CM

## 2017-09-25 DIAGNOSIS — C9002 Multiple myeloma in relapse: Principal | ICD-10-CM

## 2017-09-25 DIAGNOSIS — G620 Drug-induced polyneuropathy: Secondary | ICD-10-CM

## 2017-09-25 DIAGNOSIS — T451X5A Adverse effect of antineoplastic and immunosuppressive drugs, initial encounter: Secondary | ICD-10-CM

## 2017-10-09 ENCOUNTER — Emergency Department
Admission: EM | Admit: 2017-10-09 | Discharge: 2017-10-09 | Disposition: A | Discharge status: Home / Self Care | Payer: MEDICARE | Source: Intra-hospital

## 2017-10-09 ENCOUNTER — Emergency Department
Admission: EM | Admit: 2017-10-09 | Discharge: 2017-10-09 | Disposition: A | Discharge status: Home / Self Care | Payer: MEDICARE | Source: Intra-hospital | Attending: Emergency Medicine

## 2017-10-09 DIAGNOSIS — C9002 Multiple myeloma in relapse: Principal | ICD-10-CM

## 2017-10-15 ENCOUNTER — Ambulatory Visit
Admission: RE | Admit: 2017-10-15 | Discharge: 2017-10-15 | Disposition: A | Discharge status: Home / Self Care | Payer: MEDICARE

## 2017-10-15 DIAGNOSIS — C9002 Multiple myeloma in relapse: Secondary | ICD-10-CM

## 2017-10-15 DIAGNOSIS — C9000 Multiple myeloma not having achieved remission: Principal | ICD-10-CM

## 2017-10-16 MED ORDER — POMALYST 2 MG CAPSULE
ORAL_CAPSULE | Freq: Every day | ORAL | 0 refills | 0 days | Status: CP
Start: 2017-10-16 — End: 2017-11-03

## 2017-10-20 ENCOUNTER — Ambulatory Visit
Admit: 2017-10-20 | Discharge: 2017-11-03 | Disposition: A | Discharge status: Skilled Nursing Facility | Payer: MEDICARE

## 2017-10-20 ENCOUNTER — Encounter
Admit: 2017-10-20 | Discharge: 2017-11-03 | Disposition: A | Discharge status: Skilled Nursing Facility | Payer: MEDICARE | Attending: Student in an Organized Health Care Education/Training Program

## 2017-10-20 DIAGNOSIS — I96 Gangrene, not elsewhere classified: Principal | ICD-10-CM

## 2017-10-21 DIAGNOSIS — I96 Gangrene, not elsewhere classified: Principal | ICD-10-CM

## 2017-10-22 DIAGNOSIS — I96 Gangrene, not elsewhere classified: Principal | ICD-10-CM

## 2017-10-25 DIAGNOSIS — I96 Gangrene, not elsewhere classified: Principal | ICD-10-CM

## 2017-11-03 MED ORDER — SENNOSIDES 8.6 MG TABLET
ORAL_TABLET | Freq: Every evening | ORAL | 0 refills | 0 days | PRN
Start: 2017-11-03 — End: 2018-04-16

## 2017-11-03 MED ORDER — POLYETHYLENE GLYCOL 3350 17 GRAM ORAL POWDER PACKET
PACK | Freq: Every day | ORAL | 0 refills | 0.00000 days | PRN
Start: 2017-11-03 — End: ?

## 2017-11-03 MED ORDER — OXYCODONE 5 MG TABLET
ORAL_TABLET | ORAL | 0 refills | 0 days | Status: CP | PRN
Start: 2017-11-03 — End: 2018-01-27

## 2017-11-04 MED ORDER — CLOPIDOGREL 75 MG TABLET
ORAL_TABLET | Freq: Every day | ORAL | 0 refills | 0 days
Start: 2017-11-04 — End: 2017-12-16

## 2017-11-16 ENCOUNTER — Encounter: Admit: 2017-11-16 | Discharge: 2017-11-17 | Payer: MEDICARE

## 2017-11-16 ENCOUNTER — Ambulatory Visit: Admit: 2017-11-16 | Discharge: 2017-11-17 | Payer: MEDICARE | Attending: Adult Health | Primary: Adult Health

## 2017-11-16 DIAGNOSIS — C9002 Multiple myeloma in relapse: Principal | ICD-10-CM

## 2017-11-16 DIAGNOSIS — N183 Chronic kidney disease, stage 3 (moderate): Secondary | ICD-10-CM

## 2017-11-16 DIAGNOSIS — T451X5A Adverse effect of antineoplastic and immunosuppressive drugs, initial encounter: Secondary | ICD-10-CM

## 2017-11-16 DIAGNOSIS — I96 Gangrene, not elsewhere classified: Secondary | ICD-10-CM

## 2017-11-16 DIAGNOSIS — G620 Drug-induced polyneuropathy: Secondary | ICD-10-CM

## 2017-11-16 NOTE — Unmapped (Signed)
[facility] Follow Up      REASON FOR VISIT: Ms. Plotz presents today for follow-up after hospital discharge    ASSESSMENT:  #1 IgG kappa symptomatic multiple myeloma - ISS Stage II    #2 CKD, Stage 3B, moderate (CrCl estimated 32.1 mL/min).  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  Recovering well from surgery. Did not assess toe as it is wrapped and she will be seeing vascular surgery on Wednesday for follow up. Creatinine slightly elevated this morning, though she reports that she is not drinking as much as she usually does.  Calcium 9.9.  She is ready to start new treatment.  We reviewed rationale and process for getting started.  We will submit test claims for this to [facility] to hopefully get this out ASAP.    Neuropathy continues to be stable - not very noticable. She has not noticed any changes.  Reports intermittent tingling in hands that is resolved with one mustard packet prn.        RECOMMENDATIONS:  1. Start Melphalan/Ixazomib/Pred   Ixazomib 4 mg po days 1, 8, 15   Melphalan 39m/m2 (10mg ) po days 1-4   Prednisone 60mg /m2 (100mg ) po days 1-4   1 cycle = 28 days  2. Prophylaxis    VTE ppx: ASA 81mg  PO daily   VZV ppx: Valacyclovir  3. PCP for OA pain  4. Follow up in 4 weeks or sooner PRN    [name], AGPCNP-C, MSN, OCN  Nurse Practitioner  Hematologic Malignancies  [facility]  [phone] (phone)  [phone] (fax)  [email or web address]        ONCOLOGY HISTORY:  Oncology History    Diagnosis:  Symptomatic IgG kappa multiple myeloma, 01/11/14.  Ms. Semple was diagnosed with symptomatic myeloma at the age of 77 year old when she presented with symptomatic hypercalcemia.  Evaluation revealed IgG kappa symptomatic multiple myeloma based upon SPEP M-protein of 2.5 g/dL, anemia, hypercalcemia, and lytic lesions documented on skeletal survey. Bone marrow biopsy performed 01/11/14 showed normocellular bone marrow (60%) with involvement by plasma cell dyscrasia (35% plasma cells by aspirate differential, 80% plasma cells by CD138 immunohistochemical analysis of clot, and monotypic kappa by in-situ hybridization).  IgG elevated at 3208 mg/dL, IgM <29, IgA 562.     Stage @ Diagnosis: ISS 2 (B2M 4.11).   ISS I (B2M <3.5 mg/dL and albumin >1.3 g/dL) - Median OS 62 months  ISS II - neither I nor III. - Median OS 44 months  ISS III - B2M >5.5 mg/dL. - Median OS 29 months      Risk Stratification:  High risk based upon +1q  Cytogenetics:    Normal karyotype: 46,XY,inv(9)(p11q13)c[20]   Abnormal FISH: A multiple myeloma FISH panel, on a CD138+ plasma cell-enriched fraction, performed at [facility]) was abnormal. A percentage of the cells were positive for three 1q signals, monosomy 13, and trisomy 9 and 15. Gain of 1q is associated with a worse prognosis in myeloma patients.     LDH: normal at diagnosis    Treatment History/Disease Course:  1. CyBorD - x 9 cycles. VGPR, best response.  2. Lenalidomide maintenance - 10/2014 - n/v/malaise --> AKI. Subsequently changed to bortezomib q2week maintenance --> PD 07/25/15, with M-spike increased  from TLTQ to 2.0 g/dL.    3. Carfilzomib 36mg /m2 + dexamethasone  TTE 07/2015 with normal EF. Addition of revlimid 15 mg 21/28 day cycle on C2. Progression 02/27/16  4. Daratumumab initiated 03/05/16 - PD  5. Cy/Pom/Dex - 04/16/16  6. Benda/Pom/Dex 03/10/17        Bone Health:  - Myeloma survey from 01/09/14: Lateral views of the calvarium show multiple small lytic lesions throughout calvarium. There is diffuse osteopenia throughout the spine. A severe compression fracture at L1 includes mild loss of the posterior vertebral body height but no listhesis. There is also endplate invagination superiorly and inferiorly at L5.  AP views of the appendicular skeleton again show diffuse osteopenia. Lytic lesions are present in the proximal and distal right femur, proximal left femur and left proximal humerus. No pathologic fracture is seen at this time.  - Bisphosphonate:  Received pamidronate in 12/2013 while hospitalized.  Starting monthly zoledronic acid 4mg  IV monthly x 2 years on 02/22/14.    Transplant Status:  Not transplanted; had initial visit with Dr. Lucretia Roers on 04/03/14. Elected to forgo cell collection or transplant.         Multiple myeloma in relapse (CMS-HCC)    01/11/2014 Initial Diagnosis     Multiple myeloma, IgG kappa.  ISS 2.  Presented with hypercalcemia and anemia.  Renal failure corrected after IV hydration.  M-spike 2.5 g/dL         10/25/1094 -  Chemotherapy     CyBorD         02/22/2014 Endocrine/Hormone Therapy     Zometa Therapy 4 mg IV monthly. Plan for two years of therapy         10/27/2014 -  Chemotherapy     Bortezomib maintenance 1.3 mg/m2 [place] q2 weeks         07/25/2015 Progression     M-spike 2.0.  CKD stable, cr 1.45 mg/dL.  Hgb 10.6, slightly reduced from steady-state.         08/08/2015 - 02/27/2016 Chemotherapy     Carfilzomib + dexamethaonse with addition of revlimid 15 mg 21/28 day cycle on C2. 10/03/15 C3 revlimid discontinued after one cycle (C2). C5 delayed a week for URI.         02/17/2016 Progression            02/27/2016 Progression     M-spike to 2.3         03/05/2016 -  Chemotherapy     Single agent Daratumumab         04/14/2016 Progression     M-spike 3.3 up from 2.3 on single agent dara         04/15/2016 -  Chemotherapy     Initiated pom/cy/dex; Pom 4mg  d1-21, Cytoxan 400mg  PO days 1, 8, 15, dex 20mg  PO days 1, 8, 15, 22         05/05/2016 -  Chemotherapy     Dose reduced pomalidomide to 2mg  d/t AKI         03/10/2017 -  Chemotherapy     Benda/Pom/Dex         10/09/2017 Progression     M-spike 1.4.  Initiation of treatment delayed by gangrene of right 4th and 5th toes, with hospitalization            Chemotherapy     Ixazomib/Melphalan/Prednisone  M-spike 3.4            INTERVAL HISTORY:  Mrs. Kohls presents to clinic today with her  daughter for follow up.  She was admitted for gangrene of right 4th and 5th toes, now s/p amputation.  She does report some pain in her foot, and is taking oxycodone with good results.  Bowels are moving well still.  Reports one episode of nausea yesterday, but didn't take anything for it.  She is up walking around with the boot on her foot.  Appetite is the same.    Otherwise, denies new bone pain, fevers, chills, night sweats, lumps/bumps, tongue swelling, shortness of breath, syncope, lightheadedness, constipation, nausea or vomiting, very easy bruising or bleeding, or urinary changes.         REVIEW OF SYSTEMS:    10-systems otherwise reviewed and negative except as per Interval History.    PAST MEDICAL HISTORY:    Past Medical History:   Diagnosis Date   ??? Arthritis    ??? High cholesterol    ??? Hypertension    ??? Multiple myeloma (CMS-HCC) 01/18/2014         PAST SURGICAL HISTORY:    Past Surgical History:   Procedure Laterality Date   ??? negative surgical history     ??? PR AMPUTATION TOE,MT-P JT Right 10/24/2017    Procedure: AMPUTATION, TOE; METATARSOPHALANGEAL JOINT;  Surgeon: Boykin Reaper, MD;  Location: MAIN OR [facility];  Service: Vascular         ALLERGIES: No Known Allergies    MEDICATIONS:    Current Outpatient Prescriptions   Medication Sig Dispense Refill   ??? acetaminophen (TYLENOL) 325 MG tablet Take 2 tablets (650 mg total) by mouth every six (6) hours as needed. 30 tablet 0   ??? alendronate (FOSAMAX) 70 MG tablet Take 1 tablet by mouth once a week. Takes on Tuesday AM     ??? amLODIPine (NORVASC) 5 MG tablet      ??? clopidogrel (PLAVIX) 75 mg tablet Take 1 tablet (75 mg total) by mouth daily. 30 tablet 0   ??? doxycycline (VIBRA-TABS) 100 MG tablet      ??? losartan (COZAAR) 50 MG tablet      ??? magnesium oxide (MAG-OX) 400 mg tablet Take 1 tablet (400 mg total) by mouth Two (2) times a day. 120 tablet 2   ??? metoprolol tartrate (LOPRESSOR) 25 MG tablet Take 0.5 tablets (12.5 mg total) by mouth Two (2) times a day. 30 tablet 0   ??? mirtazapine (REMERON) 45 MG tablet Take 1 tablet (45 mg total) by mouth nightly. 30 tablet 6   ??? ondansetron (ZOFRAN-ODT) 4 MG disintegrating tablet Take 1 tablet (4 mg total) by mouth every eight (8) hours as needed for nausea. 60 tablet 3   ??? oxyCODONE (ROXICODONE) 5 MG immediate release tablet      ??? polyethylene glycol (MIRALAX) 17 gram packet Take 17 g by mouth daily as needed. 10 packet 0   ??? potassium chloride (KLOR-CON) 10 MEQ CR tablet Take 2 tablets (20 mEq total) by mouth daily. 30 tablet 11   ??? senna (SENOKOT) 8.6 mg tablet Take 1 tablet by mouth nightly as needed for constipation. 10 tablet 0   ??? terbinafine HCl (LAMISIL) 1 % cream Apply topically Two (2) times a day.     ??? zinc gluconate 100 mg Tab Take 100 mg by mouth daily at 0600. 30 each 11   ??? amLODIPine (NORVASC) 10 MG tablet Take 0.5 tablets (5 mg total) by mouth daily. (Patient not taking: Reported on 11/16/2017) 30 tablet 0   ??? ixazomib (NINLARO) 4 mg capsule  Take 1 capsule (4 mg total) by mouth every seven (7) days. Take at least 1 hour before or at least 1 hours after food. Take days 1, 8, 15. 3 capsule 1   ??? melphalan (ALKERAN) 2 mg tablet Take 5 tablets (10 mg total) by mouth Take as directed. for 4 days Take 5 tabs days 1-4 of a 28-day cycle. 20 tablet 0   ??? predniSONE (DELTASONE) 50 MG tablet Take 2 tablets (100 mg total) by mouth daily. for 4 days Days 1-4 of a 28-day cycle 8 tablet 1   ??? silver sulfaDIAZINE (SILVADENE, SSD) 1 % cream Apply topically daily. 50 g 0   ??? telmisartan (MICARDIS) 40 MG tablet Take 40 mg by mouth daily.       No current facility-administered medications for this visit.          PHYSICAL EXAMINATION:  VITALS:   Vitals:    11/16/17 0913   BP: 163/78   Pulse: 83   Resp: 16   Temp: 36.8 ??C (98.3 ??F)   TempSrc: Oral   SpO2: 98% Weight: 57.8 kg (127 lb 6.4 oz)   Height: 170.2 cm (5' 7.01)       ECOG PERFORMANCE STATUS: 1    GENERAL:No acute distress. She is ambulatory without assistive devices today.  She is accompanied by her daughter, Misty Stanley.  HEENT: Anicteric sclera. No conjunctival pallor. No oropharyngeal exudates, erythema, nor ulceration.  Dentures are poorly fitted and affect her speech.  LYMPH: No cervical, supraclavicular, nor axillary adenopathy.  CV: S1,S2, no murmurs, rubs, nor gallops.    LUNGS: Speaking comfortably in full sentences without increased work of breathing. Clear to auscultation bilaterally.  ABDOMEN: Soft, non-tender, and non-distended. Normoactive bowel sounds. No hepatosplenomegaly.  EXTREMITIES: No edema.    LABS/IMAGING/OTHER TESTS:

## 2017-11-18 ENCOUNTER — Ambulatory Visit: Admit: 2017-11-18 | Discharge: 2017-11-19 | Payer: MEDICARE | Attending: Vascular Surgery | Primary: Vascular Surgery

## 2017-11-18 DIAGNOSIS — I739 Peripheral vascular disease, unspecified: Principal | ICD-10-CM

## 2017-11-18 MED ORDER — PREDNISONE 50 MG TABLET
ORAL_TABLET | Freq: Every day | ORAL | 1 refills | 0.00000 days | Status: CP
Start: 2017-11-18 — End: 2017-11-18

## 2017-11-18 MED ORDER — IXAZOMIB 4 MG CAPSULE: 4 mg | capsule | 1 refills | 0 days | Status: AC

## 2017-11-18 MED ORDER — IXAZOMIB 4 MG CAPSULE
ORAL_CAPSULE | ORAL | 1 refills | 0.00000 days | Status: CP
Start: 2017-11-18 — End: 2017-12-30

## 2017-11-18 MED ORDER — PREDNISONE 50 MG TABLET: tablet | 1 refills | 0 days

## 2017-11-18 MED ORDER — SILVER SULFADIAZINE 1 % TOPICAL CREAM
Freq: Every day | TOPICAL | 0 refills | 0 days | Status: CP
Start: 2017-11-18 — End: 2017-12-16

## 2017-11-18 MED ORDER — MELPHALAN 2 MG TABLET
ORAL | 0 refills | 0.00000 days | Status: CP
Start: 2017-11-18 — End: 2017-11-18

## 2017-11-18 MED ORDER — MELPHALAN 2 MG TABLET: 10 mg | tablet | 0 refills | 0 days | Status: AC

## 2017-11-18 NOTE — Unmapped (Signed)
I did not see the patient, however, am overseeing her myeloma management.  I agree with Ms. Sawchak that the patient has clearly progressive multiple myeloma and needs to change therapy.  I discussed this with the patient at her recent inpatient admission for osteomyelitis of the toe as well.      Given the patient's frailty, she is not a good candidate for autologous stem cell transplant.  She has been throguh multiple lines of treatment including:    - CyBorD  - KRD  - Daratumumab monotherapy  - Cyclophosphamide, pomalidomide, dexamethasone  - Bendamustine, pomalidomide, dexamethasone    She is not a robust candidate for high-dose CFZ with panobinostat.  She has not had an t(11;14).  She is not an ideal study candidate, given the CKD with CrCl ~30 ml/min.     Her best disease response has been to proteasome inhibitors.  With that, I would like to re-incorporate proteasome inhibition into her regimen.  I had previously discussed the situation with Ms. Weinfeld and her daughter and they were in agreement that she would like more treatment, but an all-oral option is preferred.  With that, I would like to try: ixazomib-melphalan-prednisone as follows:    Ixazomib 4 mg po days 1, 8, 15  Melphalan 34m/m2 (=10mg ) po days 1-4  Prednisone 60mg /m2 (=100mg ) po days 1-4    Prophylaxis: valacyclovir 500mg  po daily    Main side effects include cytopenias, n/v/diarrhea, pneumonia.  This combination was used in newly diagnosed myeloma where it had an ORR of 66%.  While I recognize that this is relapsed-refractory disease, there is reason to believe that the combination would have activity in this patient.      Reference: Haematologica September 2018 103: [id number]    Rozetta Nunnery, MD  Hematology/Oncology

## 2017-11-18 NOTE — Unmapped (Signed)
Right dorsal foot (1st met head) thick scab/eschar.  No drainage.  Right 4th and 5th toe amp site.  90%, 10% slough measures 2.5x6x0.cm

## 2017-11-18 NOTE — Unmapped (Signed)
See wound care orders.  Please pick up silvadene from pharmacy.  I have sent orders to [facility] home health care to do wound care.

## 2017-11-19 NOTE — Unmapped (Signed)
Per test claim for [facility] at the [facility], patient needs Medication Assistance Program for Prior Authorization.

## 2017-11-22 NOTE — Unmapped (Signed)
NINLARO APPROVED FOR $8.50

## 2017-11-23 NOTE — Unmapped (Signed)
[facility] Specialty Medication Referral: No PA required    Medication (Brand/Generic): MELPHALAN 2MG  TABS    Initial FSI Test Claim completed with resulted information below:  No PA required  Patient ABLE to fill at [facility]  Insurance Company:  [id number]  Anticipated Copay: $33.50    As Co-pay is under $100 defined limit, per policy there will be no further investigation of need for financial assistance at this time unless patient requests. This referral has been communicated to the provider and handed off to the [facility] team for further processing and filling of prescribed medication.   ______________________________________________________________________  Please utilize this referral for viewing purposes as it will serve as the central location for all relevant documentation and updates.

## 2017-11-24 MED ORDER — MELPHALAN 2 MG TABLET
ORAL_TABLET | ORAL | 0 refills | 0.00000 days
Start: 2017-11-24 — End: 2017-12-18

## 2017-11-24 MED ORDER — PREDNISONE 50 MG TABLET
ORAL_TABLET | Freq: Every day | ORAL | 1 refills | 0.00000 days
Start: 2017-11-24 — End: 2018-01-27

## 2017-11-24 NOTE — Unmapped (Signed)
S/O: Kayla Torres is a 77 year old woman with multiple myeloma with plans to start oral chemotherapy with Ixazomib, Melphalan, and Prednisone on 12/03/17 (details below).    Chemotherapy Regimen:  - Ixazomib 4 mg PO days 1, 8, 15  - Melphalan 30m/m2 (=10mg ) po days 1-4  - Prednisone 60mg /m2 (=100mg ) po days 1-4    Initiation of Therapy Assessment:   -Complete medication list reviewed.  No issues were identified  -Past medical history reviewed.  -Allergies were reviewed  -Relevant cultural assessment and health literacy was completed.  Patient is able to store his/her medication as directed  -This patient does not have any cognitive or physical disabilities   -This patient does not speak any other languages.      The following was explained to the patient:  Advised patient of the following:  -A Welcome packet will be sent to the patient   -Assignment of Benefit for the patient to review and return before next refill  -Copay or out-of-pocket responsibility  -Arrangement of payment method can be done by contacting the pharmacy  -Take medications with during travel, have doctor's appointments, or if being admitted to the hospital.  Advised patient of refill order process:  -Pharmacy must speak to the patient every month to receive medication  -Patient may call the pharmacy, or the pharmacy will call about a week before the refill is due    Patient verbalized understanding of the above information.     Patient address in Epic was confirmed with patient to be correct.    A/P: [facility] pharmacy to contact patient to schedule delivery of Ixazomib, Melphalan, and Prednisone. Reviewed copays with patient and she reports that they are affordable for her. Instructed patient to NOT start her medications prior to her appointment on 12/03/17 where she will be further educated and instructed on each medication. Patient verbalized understanding of these instructions.     Time spent in coordination of care: 20 minutes    [place] Martavia Tye, PharmD, BCPS, BCOP, CPP  Clinical Pharmacist Practitioner, Myeloma and Lymphoma  Pager: [phone]

## 2017-11-26 MED FILL — PREDNISONE/50MG/TAB: PREDNISONE/50MG/TAB | 4 days supply | Qty: 8 | Fill #0

## 2017-11-26 MED FILL — MELPHALAN/2MG/TABS: MELPHALAN/2MG/TABS | 4 days supply | Qty: 20 | Fill #0

## 2017-11-26 MED FILL — NINLARO/4MG/CAPS: NINLARO/4MG/CAPS | 28 days supply | Qty: 3 | Fill #0

## 2017-11-27 NOTE — Unmapped (Signed)
POST OP CHECK    S/p right 4th and 5th metatarsal toe amps on Jan 7. She has healed pretty well, but has some necrosis of the skin at the closed edge of the surgical site. No drainage or s/sx of infection. Indication for amps was ischemic tissue loss, no infection. Sutures removed today.    Plan:  Silvadene to wound  RTC 2 weeks

## 2017-12-02 ENCOUNTER — Encounter: Admit: 2017-12-02 | Discharge: 2017-12-03 | Payer: MEDICARE | Attending: Vascular Surgery | Primary: Vascular Surgery

## 2017-12-02 DIAGNOSIS — I739 Peripheral vascular disease, unspecified: Principal | ICD-10-CM

## 2017-12-02 DIAGNOSIS — IMO0002 Toe amputation status, right: Secondary | ICD-10-CM

## 2017-12-02 NOTE — Unmapped (Signed)
Right foot toe amp site, toes 3 and 4 4x2x1 90%/10 slough/granulation. Moderate drainage on dressing, no odor noted.

## 2017-12-03 ENCOUNTER — Encounter: Admit: 2017-12-03 | Discharge: 2017-12-03 | Payer: MEDICARE

## 2017-12-03 ENCOUNTER — Ambulatory Visit: Admit: 2017-12-03 | Discharge: 2017-12-03 | Payer: MEDICARE | Attending: Oncology | Primary: Oncology

## 2017-12-03 DIAGNOSIS — C9002 Multiple myeloma in relapse: Principal | ICD-10-CM

## 2017-12-03 DIAGNOSIS — C9000 Multiple myeloma not having achieved remission: Secondary | ICD-10-CM

## 2017-12-03 LAB — CBC W/ AUTO DIFF
BASOPHILS ABSOLUTE COUNT: 0 10*9/L (ref 0.0–0.1)
BASOPHILS RELATIVE PERCENT: 0.4 %
EOSINOPHILS ABSOLUTE COUNT: 0.1 10*9/L (ref 0.0–0.4)
EOSINOPHILS RELATIVE PERCENT: 3.1 %
HEMATOCRIT: 25.6 % — ABNORMAL LOW (ref 36.0–46.0)
HEMOGLOBIN: 8.3 g/dL — ABNORMAL LOW (ref 12.0–16.0)
LARGE UNSTAINED CELLS: 5 % — ABNORMAL HIGH (ref 0–4)
LYMPHOCYTES ABSOLUTE COUNT: 0.5 10*9/L — ABNORMAL LOW (ref 1.5–5.0)
LYMPHOCYTES RELATIVE PERCENT: 14.7 %
MEAN CORPUSCULAR HEMOGLOBIN CONC: 32.5 g/dL (ref 31.0–37.0)
MEAN CORPUSCULAR HEMOGLOBIN: 30.1 pg (ref 26.0–34.0)
MEAN CORPUSCULAR VOLUME: 92.6 fL (ref 80.0–100.0)
MEAN PLATELET VOLUME: 8.9 fL (ref 7.0–10.0)
MONOCYTES ABSOLUTE COUNT: 0.2 10*9/L (ref 0.2–0.8)
MONOCYTES RELATIVE PERCENT: 6.8 %
NEUTROPHILS ABSOLUTE COUNT: 2.1 10*9/L (ref 2.0–7.5)
NEUTROPHILS RELATIVE PERCENT: 70.4 %
PLATELET COUNT: 141 10*9/L — ABNORMAL LOW (ref 150–440)
RED BLOOD CELL COUNT: 2.76 10*12/L — ABNORMAL LOW (ref 4.00–5.20)
WBC ADJUSTED: 3 10*9/L — ABNORMAL LOW (ref 4.5–11.0)

## 2017-12-03 LAB — COMPREHENSIVE METABOLIC PANEL
ALBUMIN: 3.5 g/dL (ref 3.5–5.0)
ALKALINE PHOSPHATASE: 77 U/L (ref 38–126)
ALT (SGPT): 12 U/L — ABNORMAL LOW (ref 15–48)
ANION GAP: 7 mmol/L — ABNORMAL LOW (ref 9–15)
AST (SGOT): 17 U/L (ref 14–38)
BILIRUBIN TOTAL: 0.4 mg/dL (ref 0.0–1.2)
BLOOD UREA NITROGEN: 8 mg/dL (ref 7–21)
BUN / CREAT RATIO: 6
CALCIUM: 9.4 mg/dL (ref 8.5–10.2)
CHLORIDE: 111 mmol/L — ABNORMAL HIGH (ref 98–107)
CO2: 25 mmol/L (ref 22.0–30.0)
CREATININE: 1.4 mg/dL — ABNORMAL HIGH (ref 0.60–1.00)
EGFR MDRD AF AMER: 44 mL/min/{1.73_m2} — ABNORMAL LOW (ref >=60)
EGFR MDRD NON AF AMER: 37 mL/min/{1.73_m2} — ABNORMAL LOW (ref >=60)
POTASSIUM: 4 mmol/L (ref 3.5–5.0)
PROTEIN TOTAL: 8.9 g/dL — ABNORMAL HIGH (ref 6.5–8.3)
SODIUM: 143 mmol/L (ref 135–145)

## 2017-12-03 LAB — BASOPHILS ABSOLUTE COUNT: Lab: 0

## 2017-12-03 LAB — MYELOMA SERUM CHEMISTRIES: PROTEIN TOTAL: 8.5 g/dL — ABNORMAL HIGH (ref 6.5–8.3)

## 2017-12-03 LAB — ANION GAP: Anion gap 3:SCnc:Pt:Ser/Plas:Qn:: 7 — ABNORMAL LOW

## 2017-12-03 LAB — GAMMAGLOBULIN; IGA: IgA:MCnc:Pt:Ser/Plas:Qn:: 16.2 — ABNORMAL LOW

## 2017-12-03 LAB — SLIDE REVIEW

## 2017-12-03 LAB — SMEAR REVIEW

## 2017-12-03 NOTE — Unmapped (Signed)
Clinical Pharmacist Practitioner Chemotherapy Education    Chemotherapy Regimen:  - Ixazomib 4 mg PO days 1, 8, 15  - Melphalan 56m/m2 (=10mg ) po days 1-4  - Prednisone 60mg /m2 (=100mg ) po days 1-4    Kayla Torres was counseled today prior to initiation of chemotherapy.  Side effects discussed included but were not limited to: complications associated with myelosuppresion (such as infection/fever, fatigue, and bleeding), nausea/vomiting, diarrhea/constipation, peripheral neuropathy, urine discoloration, renal toxicity, and fatigue.     Both Ixazomib and Melphalan should be taken on an empty stomach. It was recommended to take Prednisone with food to avoid GI upset. Also instructed patient that Melphalan tablets need to be stored in refrigerator.     The patient was provided with the following:  - Written chemotherapy education  - Treatment calendar  - Clinic contact numbers and Hematology/Oncology Fellow on-call phone number for concerns after hours were given.      ? Jan 2019 February  2019 Mar 2019 ?   [name] [facility] Tue Wed Thu Fri Sat   10    11    12    13    14    Ixazomib 4 mg (no food)  Melphalan 10 mg (no food)  Prednisone 100 mg (with food) 15   Melphalan 10 mg (no food)  Prednisone 100 mg (with food) 16   Melphalan 10 mg (no food)  Prednisone 100 mg (with food)   17   Melphalan 10 mg (no food)  Prednisone 100 mg (with food) 18    19    20    21    Ixazomib 4 mg (no food)   22    23      24    25    26    27    28    Ixazomib 4 mg (no food)        ? Feb 2019 March  2019 Apr 2019 ?   Sun Mon Tue Wed Thu Fri Sat        1   REST  2   REST   3   REST 4   REST 5   REST 6   REST 7   REST 8   REST 9   REST   10   REST 11   REST 12   REST 13   REST 14      15    16           Teach back was used and the patient verbalized understanding of this information. Medication reconciliation was completed and the medication list was updated in EPIC.     Approximate time spent with patient: 30  Minutes.    [place] Gerasimos Plotts, PharmD, BCPS, BCOP, CPP  Clinical Pharmacist Practitioner, Myeloma and Lymphoma  Pager: [phone]

## 2017-12-04 LAB — KAPPA FREE,SERUM: Lab: 96.19 — ABNORMAL HIGH

## 2017-12-04 LAB — IMMUNOGLOBULIN FREE LT CHAINS BLOOD: LAMBDA FREE, SER: <0.25 mg/dL — ABNORMAL LOW (ref 0.57–2.63)

## 2017-12-05 LAB — MYELOMA WORKUP, SERUM
ALPHA-1 GLOBULIN: 0.4 g/dL (ref 0.2–0.5)
ALPHA-2 GLOBULIN: 0.8 g/dL (ref 0.5–1.1)
BETA-1 GLOBULIN: 0.3 g/dL (ref 0.3–0.6)
BETA-2 GLOBULIN: 4 g/dL — ABNORMAL HIGH (ref 0.2–0.6)
GAMMAGLOBULIN: 0.1 g/dL — ABNORMAL LOW (ref 0.5–1.5)
M SPIKE: 3.8 g/dL — ABNORMAL HIGH

## 2017-12-05 LAB — M SPIKE: Lab: 3.8 — ABNORMAL HIGH

## 2017-12-08 NOTE — Unmapped (Signed)
WOUND CHECK    S/p right 4th and 5th metatarsal toe amps on Jan 7. She is progressing pretty well, but has some necrosis of the skin and fibrinous exudate that was excised today in clinic. There is granulation tissue present at the base of the wound. No drainage or s/sx of infection. Indication for amps was ischemic tissue loss, no infection.     Plan:  Silvadene to wound  Return to wound clinic for continued wound check  Return to vascular clinic with [facility] for PAD surveillance in 3 months

## 2017-12-16 ENCOUNTER — Encounter: Admit: 2017-12-16 | Discharge: 2017-12-17 | Payer: MEDICARE

## 2017-12-16 ENCOUNTER — Encounter: Admit: 2017-12-16 | Discharge: 2017-12-17 | Payer: MEDICARE | Attending: Adult Health | Primary: Adult Health

## 2017-12-16 DIAGNOSIS — N183 Chronic kidney disease, stage 3 (moderate): Secondary | ICD-10-CM

## 2017-12-16 DIAGNOSIS — C9002 Multiple myeloma in relapse: Principal | ICD-10-CM

## 2017-12-16 DIAGNOSIS — T451X5A Adverse effect of antineoplastic and immunosuppressive drugs, initial encounter: Secondary | ICD-10-CM

## 2017-12-16 DIAGNOSIS — G620 Drug-induced polyneuropathy: Secondary | ICD-10-CM

## 2017-12-16 DIAGNOSIS — D701 Agranulocytosis secondary to cancer chemotherapy: Secondary | ICD-10-CM

## 2017-12-16 DIAGNOSIS — I96 Gangrene, not elsewhere classified: Secondary | ICD-10-CM

## 2017-12-16 LAB — CBC W/ AUTO DIFF
BASOPHILS ABSOLUTE COUNT: 0 10*9/L (ref 0.0–0.1)
BASOPHILS RELATIVE PERCENT: 0.3 %
EOSINOPHILS ABSOLUTE COUNT: 0 10*9/L (ref 0.0–0.4)
EOSINOPHILS RELATIVE PERCENT: 5.4 %
HEMATOCRIT: 21.9 % — ABNORMAL LOW (ref 36.0–46.0)
LARGE UNSTAINED CELLS: 9 % — ABNORMAL HIGH (ref 0–4)
LYMPHOCYTES ABSOLUTE COUNT: 0.1 10*9/L — ABNORMAL LOW (ref 1.5–5.0)
MEAN CORPUSCULAR HEMOGLOBIN CONC: 32.2 g/dL (ref 31.0–37.0)
MEAN CORPUSCULAR HEMOGLOBIN: 30.2 pg (ref 26.0–34.0)
MEAN CORPUSCULAR VOLUME: 93.8 fL (ref 80.0–100.0)
MEAN PLATELET VOLUME: 9.3 fL (ref 7.0–10.0)
MONOCYTES ABSOLUTE COUNT: 0 10*9/L — ABNORMAL LOW (ref 0.2–0.8)
MONOCYTES RELATIVE PERCENT: 10.1 %
NEUTROPHILS ABSOLUTE COUNT: 0.1 10*9/L — CL (ref 2.0–7.5)
NEUTROPHILS RELATIVE PERCENT: 39.7 %
PLATELET COUNT: 66 10*9/L — ABNORMAL LOW (ref 150–440)
RED BLOOD CELL COUNT: 2.33 10*12/L — ABNORMAL LOW (ref 4.00–5.20)
RED CELL DISTRIBUTION WIDTH: 21.4 % — ABNORMAL HIGH (ref 12.0–15.0)
WBC ADJUSTED: 0.4 10*9/L — CL (ref 4.5–11.0)

## 2017-12-16 LAB — CBC

## 2017-12-16 LAB — K/L FLC RATIO: Lab: 0

## 2017-12-16 LAB — GAMMAGLOBULIN; IGA: IgA:MCnc:Pt:Ser/Plas:Qn:: 12 — ABNORMAL LOW

## 2017-12-16 LAB — ADDON DIFFERENTIAL ONLY

## 2017-12-16 LAB — COMPREHENSIVE METABOLIC PANEL
ALBUMIN: 3.3 g/dL — ABNORMAL LOW (ref 3.5–5.0)
ALKALINE PHOSPHATASE: 68 U/L (ref 38–126)
ALT (SGPT): 21 U/L (ref 15–48)
ANION GAP: 9 mmol/L (ref 9–15)
AST (SGOT): 18 U/L (ref 14–38)
BILIRUBIN TOTAL: 0.3 mg/dL (ref 0.0–1.2)
BLOOD UREA NITROGEN: 10 mg/dL (ref 7–21)
BUN / CREAT RATIO: 8
CALCIUM: 8.7 mg/dL (ref 8.5–10.2)
CHLORIDE: 108 mmol/L — ABNORMAL HIGH (ref 98–107)
CO2: 26 mmol/L (ref 22.0–30.0)
EGFR MDRD AF AMER: 52 mL/min/{1.73_m2} — ABNORMAL LOW (ref >=60)
GLUCOSE RANDOM: 105 mg/dL (ref 65–179)
POTASSIUM: 3.4 mmol/L — ABNORMAL LOW (ref 3.5–5.0)
PROTEIN TOTAL: 8 g/dL (ref 6.5–8.3)
SODIUM: 143 mmol/L (ref 135–145)

## 2017-12-16 LAB — MEAN CORPUSCULAR HEMOGLOBIN CONC: Lab: 0

## 2017-12-16 LAB — SMEAR REVIEW

## 2017-12-16 LAB — BLOOD UREA NITROGEN: Urea nitrogen:MCnc:Pt:Ser/Plas:Qn:: 10

## 2017-12-16 LAB — MYELOMA SERUM CHEMISTRIES
GAMMAGLOBULIN; IGG: 4545 mg/dL — ABNORMAL HIGH (ref 600–1700)
PROTEIN TOTAL: 8.5 g/dL — ABNORMAL HIGH (ref 6.5–8.3)

## 2017-12-16 LAB — MICROCYTES: Lab: 0

## 2017-12-16 LAB — IMMUNOGLOBULIN FREE LT CHAINS BLOOD

## 2017-12-16 LAB — SLIDE REVIEW

## 2017-12-16 LAB — RED CELL DISTRIBUTION WIDTH: Lab: 21.4 — ABNORMAL HIGH

## 2017-12-16 MED ORDER — CLOPIDOGREL 75 MG TABLET
ORAL_TABLET | Freq: Every day | ORAL | 1 refills | 0 days | Status: CP
Start: 2017-12-16 — End: 2018-02-10

## 2017-12-16 MED ORDER — VALACYCLOVIR 500 MG TABLET
ORAL_TABLET | Freq: Every day | ORAL | 6 refills | 0 days | Status: CP
Start: 2017-12-16 — End: 2018-04-21

## 2017-12-16 NOTE — Unmapped (Signed)
[facility] Follow Up      REASON FOR VISIT: Ms. Libman presents today for follow-up after hospital discharge    ASSESSMENT:  #1 IgG kappa symptomatic multiple myeloma - ISS Stage II    #2 CKD, Stage 3B, moderate (CrCl estimated 32.1 mL/min).  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  She is currently C1D14 of Mel/Ixa/Pred.  She tolerated it very well, with only 1 day of nausea.  However, upon reviewing her labs, she is profoundly neutropenic, with an ANC of 0.1.  Additionally, her Hgb is 7.1 and her platelets are 66.  Therefore, we will hold her ixa tomorrow, and have her go upstairs to get Neulasta and 2u PRBCs.  We'll see her again in 2 weeks for C2D1.  Additionally, I've sent in levaquin and fluconazole for antimicrobial coverage.  Advised her on neutropenic precautions.    Neuropathy continues to be stable - not very noticable. She has not noticed any changes.  Reports intermittent tingling in hands that is resolved with one mustard packet prn.        RECOMMENDATIONS:  1. Continue Melphalan/Ixazomib/Pred   Ixazomib 4 mg po days 1, 8, 15   Melphalan 16m/m2 (10mg ) po days 1-4   Prednisone 60mg /m2 (100mg ) po days 1-4   1 cycle = 28 days  2. Prophylaxis    VZV ppx: Valacyclovir  3. Levaquin 500mg  PO daily x 14 days  4. Fluconazole 200mg  PO daily x 14 days (dose reduced for interaction with clopidrogel)  5. Neulasta today in infusion  6. Transfuse 2u PRBCs today  7. RTC 2 weeks for C2D1    [name], AGPCNP-C, MSN, OCN  Nurse Practitioner  Hematologic Malignancies  [facility] [place]  [phone] (phone)  [phone] (fax)  [email or web address]        ONCOLOGY HISTORY:  Oncology History    Diagnosis:  Symptomatic IgG kappa multiple myeloma, 01/11/14.  Ms. Hark was diagnosed with symptomatic myeloma at the age of 77 year old when she presented with symptomatic hypercalcemia.  Evaluation revealed IgG kappa symptomatic multiple myeloma based upon SPEP M-protein of 2.5 g/dL, anemia, hypercalcemia, and lytic lesions documented on skeletal survey. Bone marrow biopsy performed 01/11/14 showed normocellular bone marrow (60%) with involvement by plasma cell dyscrasia (35% plasma cells by aspirate differential, 80% plasma cells by CD138 immunohistochemical analysis of clot, and monotypic kappa by in-situ hybridization).  IgG elevated at 3208 mg/dL, IgM <29, IgA 562.     Stage @ Diagnosis: ISS 2 (B2M 4.11).   ISS I (B2M <3.5 mg/dL and albumin >1.3 g/dL) - Median OS 62 months  ISS II - neither I nor III. - Median OS 44 months  ISS III - B2M >5.5 mg/dL. - Median OS 29 months      Risk Stratification:  High risk based upon +1q  Cytogenetics:    Normal karyotype: 46,XY,inv(9)(p11q13)c[20]   Abnormal FISH: A multiple myeloma FISH panel, on a CD138+ plasma cell-enriched fraction, performed at [facility]) was abnormal. A percentage of the cells were positive for three 1q signals, monosomy 13, and trisomy 9 and 15. Gain of 1q is associated with a worse prognosis in myeloma patients.     LDH: normal at diagnosis    Treatment History/Disease Course:  1. CyBorD -  x 9 cycles. VGPR, best response.  2. Lenalidomide maintenance - 10/2014 - n/v/malaise --> AKI. Subsequently changed to bortezomib q2week maintenance --> PD 07/25/15, with M-spike increased from TLTQ to 2.0 g/dL.    3. Carfilzomib 36mg /m2 + dexamethasone  TTE 07/2015 with normal EF. Addition of revlimid 15 mg 21/28 day cycle on C2. Progression 02/27/16  4. Daratumumab initiated 03/05/16 - PD  5. Cy/Pom/Dex - 04/16/16  6. Benda/Pom/Dex 03/10/17        Bone Health:  - Myeloma survey from 01/09/14: Lateral views of the calvarium show multiple small lytic lesions throughout calvarium. There is diffuse osteopenia throughout the spine. A severe compression fracture at L1 includes mild loss of the posterior vertebral body height but no listhesis. There is also endplate invagination superiorly and inferiorly at L5.  AP views of the appendicular skeleton again show diffuse osteopenia. Lytic lesions are present in the proximal and distal right femur, proximal left femur and left proximal humerus. No pathologic fracture is seen at this time.  - Bisphosphonate:  Received pamidronate in 12/2013 while hospitalized.  Starting monthly zoledronic acid 4mg  IV monthly x 2 years on 02/22/14.    Transplant Status:  Not transplanted; had initial visit with Dr. Lucretia Roers on 04/03/14. Elected to forgo cell collection or transplant.         Multiple myeloma in relapse (CMS-HCC)    01/11/2014 Initial Diagnosis     Multiple myeloma, IgG kappa.  ISS 2.  Presented with hypercalcemia and anemia.  Renal failure corrected after IV hydration.  M-spike 2.5 g/dL         10/25/1094 -  Chemotherapy     CyBorD         02/22/2014 Endocrine/Hormone Therapy     Zometa Therapy 4 mg IV monthly. Plan for two years of therapy         10/27/2014 -  Chemotherapy     Bortezomib maintenance 1.3 mg/m2 Young q2 weeks         07/25/2015 Progression     M-spike 2.0.  CKD stable, cr 1.45 mg/dL.  Hgb 10.6, slightly reduced from steady-state.         08/08/2015 - 02/27/2016 Chemotherapy     Carfilzomib + dexamethaonse with addition of revlimid 15 mg 21/28 day cycle on C2. 10/03/15 C3 revlimid discontinued after one cycle (C2). C5 delayed a week for URI.         02/17/2016 Progression            02/27/2016 Progression     M-spike to 2.3         03/05/2016 -  Chemotherapy     Single agent Daratumumab         04/14/2016 Progression     M-spike 3.3 up from 2.3 on single agent dara         04/15/2016 -  Chemotherapy     Initiated pom/cy/dex; Pom 4mg  d1-21, Cytoxan 400mg  PO days 1, 8, 15, dex 20mg  PO days 1, 8, 15, 22         05/05/2016 -  Chemotherapy     Dose reduced pomalidomide to 2mg  d/t AKI 03/10/2017 -  Chemotherapy     Benda/Pom/Dex         10/09/2017 Progression     M-spike 1.4.  Initiation of treatment delayed by gangrene of right 4th and 5th toes, with hospitalization            Chemotherapy     Ixazomib/Melphalan/Prednisone  M-spike 3.4  INTERVAL HISTORY:  Mrs. Shor presents to clinic today with her daughter for follow up.  This is a 2 week check for tolerability while starting mel/ixa/pred.  She is tolerating her chemo well.  Vomited once after first doses but nothing after.  No diarrhea.  Her right foot is healing well, following with Vascular surgery.  Not much pain in her foot.  Up at home.  Appetite is good.    Otherwise, denies new bone pain, fevers, chills, night sweats, lumps/bumps, tongue swelling, shortness of breath, syncope, lightheadedness, constipation, nausea or vomiting, very easy bruising or bleeding, or urinary changes.         REVIEW OF SYSTEMS:    10-systems otherwise reviewed and negative except as per Interval History.    PAST MEDICAL HISTORY:    Past Medical History:   Diagnosis Date   ??? Arthritis    ??? High cholesterol    ??? Hypertension    ??? Multiple myeloma (CMS-HCC) 01/18/2014         PAST SURGICAL HISTORY:    Past Surgical History:   Procedure Laterality Date   ??? negative surgical history     ??? PR AMPUTATION TOE,MT-P JT Right 10/24/2017    Procedure: AMPUTATION, TOE; METATARSOPHALANGEAL JOINT;  Surgeon: Boykin Reaper, MD;  Location: MAIN OR [facility];  Service: Vascular         ALLERGIES: No Known Allergies    MEDICATIONS:    Current Outpatient Prescriptions   Medication Sig Dispense Refill   ??? acetaminophen (TYLENOL) 325 MG tablet Take 2 tablets (650 mg total) by mouth every six (6) hours as needed. 30 tablet 0   ??? alendronate (FOSAMAX) 70 MG tablet Take 1 tablet by mouth once a week. Takes on Tuesday AM     ??? amLODIPine (NORVASC) 10 MG tablet Take 0.5 tablets (5 mg total) by mouth daily. 30 tablet 0   ??? amLODIPine (NORVASC) 5 MG tablet      ??? clopidogrel (PLAVIX) 75 mg tablet Take 1 tablet (75 mg total) by mouth daily. 30 tablet 0   ??? doxycycline (VIBRA-TABS) 100 MG tablet      ??? ixazomib (NINLARO) 4 mg capsule Take 1 capsule (4 mg total) by mouth every seven (7) days. Take at least 1 hour before or at least 1 hours after food. Take days 1, 8, 15. 3 capsule 1   ??? losartan (COZAAR) 50 MG tablet      ??? magnesium hydroxide (PHILLIPS CHEWS) 311 MG Chew Chew 311 mg every four (4) hours as needed.     ??? magnesium oxide (MAG-OX) 400 mg tablet Take 1 tablet (400 mg total) by mouth Two (2) times a day. 120 tablet 2   ??? melphalan (ALKERAN) 2 mg tablet Take 5 tablets (10 mg total) by mouth Take as directed. Take 5 tabs days 1-4 of a 28-day cycle. 20 tablet 0   ??? metoprolol tartrate (LOPRESSOR) 25 MG tablet Take 0.5 tablets (12.5 mg total) by mouth Two (2) times a day. 30 tablet 0   ??? mirtazapine (REMERON) 45 MG tablet Take 1 tablet (45 mg total) by mouth nightly. 30 tablet 6   ??? ondansetron (ZOFRAN) 4 MG tablet Take 4 mg by mouth every eight (8) hours as needed for nausea.     ??? ondansetron (ZOFRAN-ODT) 4 MG disintegrating tablet Take 1 tablet (4 mg total) by mouth every eight (8) hours as needed for nausea. 60 tablet 3   ??? oxyCODONE (ROXICODONE) 5 MG immediate release tablet      ???  polyethylene glycol (MIRALAX) 17 gram packet Take 17 g by mouth daily as needed. 10 packet 0   ??? potassium chloride (KLOR-CON) 10 MEQ CR tablet Take 2 tablets (20 mEq total) by mouth daily. 30 tablet 11   ??? predniSONE (DELTASONE) 50 MG tablet Take 2 tablets (100 mg total) by mouth daily. Days 1-4 of a 28-day cycle 8 tablet 1   ??? Saccharomyces boulardii (FLORASTOR ORAL) Take by mouth. 1 capsule, twice daily     ??? senna (SENOKOT) 8.6 mg tablet Take 1 tablet by mouth nightly as needed for constipation. 10 tablet 0   ??? silver sulfaDIAZINE (SILVADENE, SSD) 1 % cream Apply topically daily. 50 g 0   ??? telmisartan (MICARDIS) 40 MG tablet Take 40 mg by mouth daily.     ??? terbinafine HCl (LAMISIL) 1 % cream Apply topically Two (2) times a day.     ??? zinc gluconate 100 mg Tab Take 100 mg by mouth daily at 0600. 30 each 11     No current facility-administered medications for this visit.          PHYSICAL EXAMINATION:  VITALS:   There were no vitals filed for this visit.    ECOG PERFORMANCE STATUS: 1    GENERAL:No acute distress. She is ambulatory without assistive devices today.  She is accompanied by her daughter, Misty Stanley.  HEENT: Anicteric sclera. No conjunctival pallor. No oropharyngeal exudates, erythema, nor ulceration.  Dentures are poorly fitted and affect her speech.  LYMPH: No cervical, supraclavicular, nor axillary adenopathy.  CV: S1,S2, no murmurs, rubs, nor gallops.    LUNGS: Speaking comfortably in full sentences without increased work of breathing. Clear to auscultation bilaterally.  ABDOMEN: Soft, non-tender, and non-distended. Normoactive bowel sounds. No hepatosplenomegaly.  EXTREMITIES: No edema.    LABS/IMAGING/OTHER TESTS:

## 2017-12-16 NOTE — Unmapped (Addendum)
Today your blood counts are very low because of the Melphalan you took at the beginning of the cycle.  Your neutrophil count (the bacteria fighting cells) are very low as well.  This can put you at higher risk for infection.      - We'll give you a shot today to help build up your white cells again.  - Avoid sick people and wash your hands well.  Wear a mask today while upstairs and in a large crowd of people.  - You'll also get blood today  - You will NOT take your [facility] tomorrow.  Your counts are too low for this.  - We will probably have to reduce your dose next cycle, but I will discuss with Dr. Anise Salvo  - I will send in antibiotics for you, and you will take these daily until we see you again.  - Valtrex you will take every day regardless of white cell count    Please return in 2 weeks for a follow up visit and labs.    If you have any questions, please do not hesitate to contact Kayla Torres.    Please contact Kayla Torres for any of these symptoms:    1. New bone pain  2. Unintentional weight loss  3. Unexplained fatigue  4. Swelling of your legs  5. Yellowing of the skin or eyes    When reviewing your results, please remember that the results of many of the tests we order can vary somewhat and that variation often means nothing.  Sometimes when we get results back after your clinic visit, if it looks like there???s some variation of that type, we may decide to recheck things sooner than we discussed in clinic.  If you get a call that we want to recheck things sooner, do not panic. It does not mean that things are going wrong.    For appointments & questions Monday through Friday 8 AM??? 5 PM   please call [phone] or Toll free [phone].    On Nights, Weekends and Holidays  Call [phone] and ask for the adult hematologist/oncologist on call.    [name], AGPCNP-C, MSN, OCN  Nurse Practitioner  Hematologic Malignancies  [facility]    Nurse Navigator: Layla Maw, RN  Questions and appointments M-F 8am - 5pm: [phone] or [phone]    [facility]  [address]  [place], [place] [address]  [email or web address]    Results for orders placed or performed in visit on 12/16/17   Comprehensive Metabolic Panel   Result Value Ref Range    Sodium 143 135 - 145 mmol/L    Potassium 3.4 (L) 3.5 - 5.0 mmol/L    Chloride 108 (H) 98 - 107 mmol/L    CO2 26.0 22.0 - 30.0 mmol/L    BUN 10 7 - 21 mg/dL    Creatinine 3.66 (H) 0.60 - 1.00 mg/dL    BUN/Creatinine Ratio 8     EGFR MDRD Non Af Amer 43 (L) >=60 mL/min/1.4m2    EGFR MDRD Af Amer 52 (L) >=60 mL/min/1.66m2    Anion Gap 9 9 - 15 mmol/L    Glucose 105 65 - 179 mg/dL    Calcium 8.7 8.5 - 44.0 mg/dL    Albumin 3.3 (L) 3.5 - 5.0 g/dL    Total Protein 8.0 6.5 - 8.3 g/dL    Total Bilirubin 0.3 0.0 - 1.2 mg/dL    AST 18 14 - 38 U/L    ALT 21 15 -  48 U/L    Alkaline Phosphatase 68 38 - 126 U/L   CBC w/ Differential   Result Value Ref Range    WBC 0.4 (LL) 4.5 - 11.0 10*9/L    RBC 2.33 (L) 4.00 - 5.20 10*12/L    HGB 7.1 (L) 12.0 - 16.0 g/dL    HCT 16.1 (L) 09.6 - 46.0 %    MCV 93.8 80.0 - 100.0 fL    MCH 30.2 26.0 - 34.0 pg    MCHC 32.2 31.0 - 37.0 g/dL    RDW 04.5 (H) 40.9 - 15.0 %    MPV 9.3 7.0 - 10.0 fL    Platelet 66 (L) 150 - 440 10*9/L    Neutrophils % 39.7 %    Lymphocytes % 35.7 %    Monocytes % 10.1 %    Eosinophils % 5.4 %    Basophils % 0.3 %    Absolute Neutrophils 0.1 (LL) 2.0 - 7.5 10*9/L    Absolute Lymphocytes 0.1 (L) 1.5 - 5.0 10*9/L    Absolute Monocytes 0.0 (L) 0.2 - 0.8 10*9/L    Absolute Eosinophils 0.0 0.0 - 0.4 10*9/L    Absolute Basophils 0.0 0.0 - 0.1 10*9/L    Large Unstained Cells 9 (H) 0 - 4 %    Microcytosis Slight (A) Not Present    Macrocytosis Moderate (A) Not Present    Anisocytosis Moderate (A) Not Present    Hypochromasia Slight (A) Not Present   Morphology Review   Result Value Ref Range    Smear Review Comments See Comment (A) Undefined    Rouleaux Present (A) Not Present

## 2017-12-16 NOTE — Unmapped (Signed)
Lab drawn and sent for analysis.

## 2017-12-17 LAB — MYELOMA WORKUP, SERUM
ALPHA-1 GLOBULIN: 0.4 g/dL (ref 0.2–0.5)
ALPHA-2 GLOBULIN: 0.8 g/dL (ref 0.5–1.1)
BETA-1 GLOBULIN: 0.1 g/dL — ABNORMAL LOW (ref 0.3–0.6)
GAMMAGLOBULIN: 3.8 g/dL — ABNORMAL HIGH (ref 0.5–1.5)
M SPIKE: 3.5 g/dL — ABNORMAL HIGH

## 2017-12-17 LAB — ALBUMIN (SPE): Lab: 3.2 — ABNORMAL LOW

## 2017-12-17 MED ORDER — FLUCONAZOLE 200 MG TABLET
ORAL_TABLET | Freq: Every day | ORAL | 0 refills | 0 days | Status: CP
Start: 2017-12-17 — End: 2017-12-30

## 2017-12-17 MED ORDER — LEVOFLOXACIN 500 MG TABLET
ORAL_TABLET | Freq: Every day | ORAL | 0 refills | 0.00000 days | Status: CP
Start: 2017-12-17 — End: 2018-01-27

## 2017-12-17 NOTE — Unmapped (Signed)
If you feel like this is an emergency please call 911.  For appointments or questions Monday through Friday 8AM-5PM please call [phone] or Toll Free [phone]. For Medical questions or concerns ask for the Nurse Triage Line.  On Nights, Weekends, and Holidays call [phone] and ask for the Oncologist on Call.  Reasons to call the Nurse Triage Line:  Fever of 100.5 or greater  Nausea and/or vomiting not relieved with nausea medicine  Diarrhea or constipation  Severe pain not relieved with usual pain regimen  Shortness of breath  Uncontrolled bleeding  Mental status changes

## 2017-12-17 NOTE — Unmapped (Signed)
Order was placed for PIV by Venous Access Team (VAT).  Patient was assessed for placement of a PIV. Access was obtained. Blood return noted.  Dressing intact and device well secured.  Flushed with normal saline.  Pt advised to inform RN of any s/s of discomfort at the PIV site.    Workup / Procedure Time:  15 minutes      The primary RN was notified.       Thank you,     Oris Drone RN Venous Access Team

## 2017-12-17 NOTE — Unmapped (Signed)
1656 Pt received neulasta. PIV was placed.   1750 1st unit of blood was started.  1909 1st unit of blood finished.   1913 2nd unit of blood was started.   2015 2nd unit of blood completed. Pt was given a copy of AVS. PIV was flushed and removed. Pt was discharged from clinic.

## 2017-12-18 MED ORDER — MELPHALAN 2 MG TABLET: 10 mg | tablet | 0 refills | 0 days | Status: AC

## 2017-12-18 MED ORDER — MELPHALAN 2 MG TABLET
ORAL_TABLET | ORAL | 0 refills | 0.00000 days | Status: CP
Start: 2017-12-18 — End: 2018-01-27

## 2017-12-18 NOTE — Unmapped (Signed)
[facility] Refill Coordination Note  Specialty Medication(s): Prednisone 50mg , Melphalan 2mg , Ninlaro 4mg       Kayla Torres, DOB: October 04, 1941  Phone: [phone] (home) , Alternate phone contact: N/A  Phone or address changes today?: No  All above HIPAA information was verified with patient.  Shipping Address: [address]  [place] [address]   Insurance changes? No    Completed refill call assessment today to schedule patient's medication shipment from the [facility] [phone]).      Confirmed the medication and dosage are correct and have not changed: Yes, regimen is correct and unchanged.    Confirmed patient started or stopped the following medications in the past month:  No, there are no changes reported at this time.    Are you tolerating your medication?:  Kayla Torres reports tolerating the medication.    ADHERENCE    Is this medicine transplant or covered by Medicare Part B? No.        Did you miss any doses in the past 4 weeks? No missed doses reported.    FINANCIAL/SHIPPING    Delivery Scheduled: Yes, Expected medication delivery date: 12/22/17.  However, Rx request for refills was sent to the provider as there are none remaining.     The patient will receive an FSI print out for each medication shipped and additional FDA Medication Guides as required.  Patient education from [place] or Robet Leu may also be included in the shipment.    Kayla Torres did not have any additional questions at this time.    Delivery address validated in FSI scheduling system: Yes, address listed in FSI is correct.    We will follow up with patient monthly for standard refill processing and delivery.      Thank you,  [employer]   [facility] Specialty Pharmacist

## 2017-12-21 MED FILL — PREDNISONE/50MG/TAB: PREDNISONE/50MG/TAB | 4 days supply | Qty: 8 | Fill #1

## 2017-12-21 MED FILL — MELPHALAN/2MG/TABS: MELPHALAN/2MG/TABS | 28 days supply | Qty: 20 | Fill #0

## 2017-12-21 MED FILL — NINLARO/4MG/CAPS: NINLARO/4MG/CAPS | 28 days supply | Qty: 3 | Fill #1

## 2017-12-29 ENCOUNTER — Encounter: Admit: 2017-12-29 | Discharge: 2017-12-30 | Payer: MEDICARE | Attending: Vascular Surgery | Primary: Vascular Surgery

## 2017-12-29 DIAGNOSIS — L97511 Non-pressure chronic ulcer of other part of right foot limited to breakdown of skin: Principal | ICD-10-CM

## 2017-12-29 DIAGNOSIS — IMO0002 Toe amputation status, right: Secondary | ICD-10-CM

## 2017-12-29 MED ORDER — SODIUM HYPOCHLORITE 0.25 % SOLUTION
Freq: Every day | TOPICAL | 1 refills | 0 days | Status: CP
Start: 2017-12-29 — End: 2018-03-08

## 2017-12-29 MED ORDER — SILVER SULFADIAZINE 1 % TOPICAL CREAM
Freq: Every day | TOPICAL | 2 refills | 0 days | Status: CP
Start: 2017-12-29 — End: 2018-01-11

## 2017-12-29 NOTE — Unmapped (Signed)
BP 158/76  - Pulse 94  - Temp 36.6 ??C (97.8 ??F) (Temporal)  - Ht 170.2 cm (5' 7)  - Wt 61.2 kg (135 lb) Comment: stated - BMI 21.14 kg/m??     Wound 12/29/17 Foot Right 4-5th toe amp site (Active)   Wound Status Not Healed 12/29/2017 10:43 AM   Pain 0 12/29/2017 10:43 AM   Dressing Status      Removed 12/29/2017 10:43 AM   Wound Length (cm)      2.1 12/29/2017 10:43 AM   Wound Width (cm)      1.2 12/29/2017 10:43 AM   Wound Depth (cm)      0.1 12/29/2017 10:43 AM   Area (sq cm) (Calculated) 2.52 12/29/2017 10:43 AM   Volume (mL) (Calculated) 0.25 12/29/2017 10:43 AM   Odor None 12/29/2017 10:43 AM   Margins Undefined edges 12/29/2017 10:43 AM   Peri-wound Assessment      Hemosiderin staining;Callus 12/29/2017 10:43 AM   Encounter Initial 12/29/2017 10:43 AM   Progress Initial Exam 12/29/2017 10:43 AM   Eschar % 76-100% 12/29/2017 10:43 AM   Exudate Type      Sero-sanguineous 12/29/2017 10:43 AM   Exudate Amnt      Scant 12/29/2017 10:43 AM   Thickness Eschar Covered 12/29/2017 10:43 AM   Exposed Structure N/A 12/29/2017 10:43 AM   Paring No 12/29/2017 10:43 AM   Texture Callus;Edema 12/29/2017 10:43 AM   Moisture Normal For Patient 12/29/2017 10:43 AM   Temperature WNL 12/29/2017 10:43 AM   Treatments Cleansed/Irrigation;Pharmaceutical agent 12/29/2017 10:43 AM   Picture Taken Yes 12/29/2017 10:43 AM     This is a patient who underwent amputation of the fourth and fifth toe of the right foot.  She had some open areas on the lateral aspect of the incision.  She has been treating this with Silvadene    Patient Active Problem List   Diagnosis   ??? Hypertension   ??? Multiple myeloma in relapse (CMS-HCC)   ??? Confusion   ??? Constipation   ??? CKD (chronic kidney disease) stage 3, GFR 30-59 ml/min (CMS-HCC)   ??? Peripheral neuropathy due to chemotherapy (CMS-HCC)   ??? Gangrene of foot (CMS-HCC)   ??? Chemotherapy-induced neutropenia (CMS-HCC)     Family History   Problem Relation Age of Onset   ??? Hypertension Mother    ??? Hypertension Sister    ??? Hypertension Brother      Social History     Social History   ??? Marital status: Divorced     Spouse name: N/A   ??? Number of children: N/A   ??? Years of education: N/A     Occupational History   ??? Not on file.     Social History Main Topics   ??? Smoking status: Former Smoker     Packs/day: 0.50     Quit date: 01/17/2014   ??? Smokeless tobacco: Never Used   ??? Alcohol use No   ??? Drug use: No   ??? Sexual activity: No     Other Topics Concern   ??? Not on file     Social History Narrative   ??? No narrative on file     On physical exam the patient is awake alert in no acute distress.  She is able to communicate without difficulty    Vital signs are reviewed and the patient's afebrile    There are palpable pulses present in the right foot.    There is an eschar  present over the lateral aspect of the incision site.  This was removed with the use of forceps and a #10 blade.    Impression open wound lateral aspect of right foot    Plan we will have her continue using Silvadene.  We cultured the wound and we will see her back in clinic in 2 weeks.

## 2017-12-29 NOTE — Unmapped (Addendum)
Wound care:    1)  Use one tsp hibiclens mixed with 2 cups distilled water and spray over wound then gently wipe clean   2)  1 cup white distilled vinegar to 3 cups distilled water - mix and soak on gauze and leave over the wound for 5 minutes  3)  Then Spray Anasept and/or Dakins to the wound bed and surrounding area and let dry, or apply to a gauze pad then apply to the wound for 5 minutes, before applying the dressing.    Dressing to wound as follows:    Apply silvadene, gauze, and secure with tape. Change daily.      If your wound starts to develop the following , please call the [facility] for further advise:    ??  Increased drainage  ??  Redness around the wound  ??  Strong odor from the wound when changing the bandages  ??  Increased pain    Please do not hesitate to leave a voicemail on the nurse line. We make every effort to return your call the same day or the next day. Please leave a clear message with your name, date of birth,  and your medical record number. Leave a brief description of your problem.    If you are experiencing the following, please call us for advise or consider going to the nearest local Emergency Department or call 911.    ??  Fever of 100 F  ??  Nausea or Vomitting  ??  Pus draining from your wound  ??  Redness of the whole foot or leg  ??  Severe increase in pain above your baseline.    [facility]  [phone]           Learning About Neuropathy From Chemotherapy  What is neuropathy?    Chemotherapy, or chemo, can cause damage to the nerves. This damage is called peripheral neuropathy (say puh-RIFF-uh-rul noo-RAW-puh-thee). It can affect the nerves that control your sense of touch, how you feel pain and temperature, and your muscle strength.  What are the symptoms?  Some common symptoms of neuropathy are:  ?? Numbness, tightness, and tingling. This usually starts in the fingers and toes.  ?? Loss of feeling.  ?? Burning, shooting, or stabbing pain in the legs, hands, and feet. Often the pain is worse at night.  ?? Weakness and loss of balance.  ?? A change in how sensitive you are to touch or temperature. You may sense them more or less than normal.  Neuropathy from chemo usually builds slowly over a few months. You may have your worst symptoms right after a chemo treatment. Then they may improve a bit until the next treatment.  After you finish chemo, your symptoms may improve or go away. This may take as much as a year or more. In some cases, some of the nerve damage may be permanent.  How is neuropathy treated?  For treating pain, there is no single treatment that works for everyone. You and your doctor may want to try different things.  ?? Your doctor may recommend medicines. These include pain relievers, certain kinds of antidepressants such as amitriptyline, and certain kinds of antiseizure medicine such as gabapentin.  ?? Be safe with medicines. Take your medicines exactly as prescribed. Call your doctor if you think you are having a problem with your medicine.  ?? You may try therapies such as acupuncture, physical therapy, and transcutaneous electrical nerve stimulation (  TENS). And you can try relaxation techniques such as deep breathing.  Tell your doctor right away about any new or changing symptoms during chemo. Your doctor may be able to change your chemo treatment to help prevent nerve damage.  What can you do at home?  Avoiding injury  Be careful to avoid injury.  ?? When your feet or legs feel numb, it's easier to lose your balance and fall. Try to avoid walking on uneven surfaces. At home:  ? Keep floors and stairs clear of throw rugs and clutter.  ? Install sturdy handrails on stairways.  ? Put grab bars near your shower, bathtub, and toilet.  ? If you need one, use a cane, walker, or wheelchair.  ?? To avoid burning yourself:  ? Use pot holders.  ? Avoid hot water and steam when you cook.  ? Always check water from a hot faucet or shower using a part of your body that can feel temperature normally, such as your elbow.  ?? Check your feet, legs, hands, and arms every day for skin and nail problems that can get worse without your feeling them. If you need to, have someone else check for you.  ? Look at all parts of your feet. Be sure to check your toes.  ? If needed, use a handheld mirror to inspect your feet. Or you can attach a magnifying mirror to the bathroom wall near the baseboard.  ?? Avoid walking with bare feet if they are affected.  ?? Try wearing gloves to protect your hands when you work outside.  Healthy lifestyle  Eat a balanced diet. Also, avoid alcohol and smoking. They can make neuropathy worse.  Follow-up care is a key part of your treatment and safety. Be sure to make and go to all appointments, and call your doctor if you are having problems. It's also a good idea to know your test results and keep a list of the medicines you take.  Where can you learn more?  Go to [facility] at [email or web address]  Select Preferences in the upper right hand corner, then select Health Library under Resources. Enter [phone] in the search box to learn more about Learning About Neuropathy From Chemotherapy.  Current as of: January 13, 2017  Content Version: 11.9  ?? 2006-2018 [employer]. Care instructions adapted under license by [facility]. If you have questions about a medical condition or this instruction, always ask your healthcare professional. [employer] disclaims any warranty or liability for your use of this information.

## 2017-12-30 ENCOUNTER — Encounter: Admit: 2017-12-30 | Discharge: 2017-12-30 | Payer: MEDICARE | Attending: Rheumatology | Primary: Rheumatology

## 2017-12-30 ENCOUNTER — Encounter: Admit: 2017-12-30 | Discharge: 2017-12-30 | Payer: MEDICARE | Attending: Adult Health | Primary: Adult Health

## 2017-12-30 ENCOUNTER — Encounter: Admit: 2017-12-30 | Discharge: 2017-12-30 | Payer: MEDICARE

## 2017-12-30 DIAGNOSIS — R5383 Other fatigue: Secondary | ICD-10-CM

## 2017-12-30 DIAGNOSIS — C9000 Multiple myeloma not having achieved remission: Secondary | ICD-10-CM

## 2017-12-30 DIAGNOSIS — T451X5A Adverse effect of antineoplastic and immunosuppressive drugs, initial encounter: Secondary | ICD-10-CM

## 2017-12-30 DIAGNOSIS — C9002 Multiple myeloma in relapse: Principal | ICD-10-CM

## 2017-12-30 DIAGNOSIS — N183 Chronic kidney disease, stage 3 (moderate): Secondary | ICD-10-CM

## 2017-12-30 DIAGNOSIS — Z8739 Personal history of other diseases of the musculoskeletal system and connective tissue: Secondary | ICD-10-CM

## 2017-12-30 DIAGNOSIS — M1A0610 Idiopathic chronic gout, right knee, without tophus (tophi): Secondary | ICD-10-CM

## 2017-12-30 DIAGNOSIS — D696 Thrombocytopenia, unspecified: Secondary | ICD-10-CM

## 2017-12-30 DIAGNOSIS — G620 Drug-induced polyneuropathy: Secondary | ICD-10-CM

## 2017-12-30 LAB — IRON & TIBC
IRON: 118 ug/dL (ref 35–165)
TOTAL IRON BINDING CAPACITY (CALC): 155.1 mg/dL — ABNORMAL LOW (ref 252.0–479.0)
TRANSFERRIN: 123.1 mg/dL — ABNORMAL LOW (ref 200.0–380.0)

## 2017-12-30 LAB — SLIDE REVIEW

## 2017-12-30 LAB — COMPREHENSIVE METABOLIC PANEL
ALBUMIN: 3.7 g/dL (ref 3.5–5.0)
ALKALINE PHOSPHATASE: 154 U/L — ABNORMAL HIGH (ref 38–126)
ALT (SGPT): 46 U/L (ref 15–48)
ANION GAP: 12 mmol/L (ref 9–15)
AST (SGOT): 73 U/L — ABNORMAL HIGH (ref 14–38)
BILIRUBIN TOTAL: 0.5 mg/dL (ref 0.0–1.2)
BLOOD UREA NITROGEN: 14 mg/dL (ref 7–21)
BUN / CREAT RATIO: 9
CHLORIDE: 108 mmol/L — ABNORMAL HIGH (ref 98–107)
CO2: 24 mmol/L (ref 22.0–30.0)
CREATININE: 1.62 mg/dL — ABNORMAL HIGH (ref 0.60–1.00)
EGFR MDRD AF AMER: 38 mL/min/{1.73_m2} — ABNORMAL LOW (ref >=60)
EGFR MDRD NON AF AMER: 31 mL/min/{1.73_m2} — ABNORMAL LOW (ref >=60)
GLUCOSE RANDOM: 98 mg/dL (ref 65–179)
POTASSIUM: 3.8 mmol/L (ref 3.5–5.0)
PROTEIN TOTAL: 9.2 g/dL — ABNORMAL HIGH (ref 6.5–8.3)
SODIUM: 144 mmol/L (ref 135–145)

## 2017-12-30 LAB — CBC W/ AUTO DIFF
BASOPHILS ABSOLUTE COUNT: 0 10*9/L (ref 0.0–0.1)
BASOPHILS RELATIVE PERCENT: 0.3 %
EOSINOPHILS ABSOLUTE COUNT: 0 10*9/L (ref 0.0–0.4)
EOSINOPHILS RELATIVE PERCENT: 1.1 %
HEMATOCRIT: 32 % — ABNORMAL LOW (ref 36.0–46.0)
HEMOGLOBIN: 10.4 g/dL — ABNORMAL LOW (ref 12.0–16.0)
LARGE UNSTAINED CELLS: 2 % (ref 0–4)
LYMPHOCYTES ABSOLUTE COUNT: 0.3 10*9/L — ABNORMAL LOW (ref 1.5–5.0)
LYMPHOCYTES RELATIVE PERCENT: 8.5 %
MEAN CORPUSCULAR HEMOGLOBIN CONC: 32.5 g/dL (ref 31.0–37.0)
MEAN CORPUSCULAR HEMOGLOBIN: 31 pg (ref 26.0–34.0)
MEAN CORPUSCULAR VOLUME: 95.4 fL (ref 80.0–100.0)
MEAN PLATELET VOLUME: 10.3 fL — ABNORMAL HIGH (ref 7.0–10.0)
MONOCYTES ABSOLUTE COUNT: 0.2 10*9/L (ref 0.2–0.8)
NEUTROPHILS ABSOLUTE COUNT: 2.8 10*9/L (ref 2.0–7.5)
NEUTROPHILS RELATIVE PERCENT: 82.5 %
PLATELET COUNT: 37 10*9/L — ABNORMAL LOW (ref 150–440)
RED BLOOD CELL COUNT: 3.36 10*12/L — ABNORMAL LOW (ref 4.00–5.20)
RED CELL DISTRIBUTION WIDTH: 21.9 % — ABNORMAL HIGH (ref 12.0–15.0)
WBC ADJUSTED: 3.4 10*9/L — ABNORMAL LOW (ref 4.5–11.0)

## 2017-12-30 LAB — FERRITIN: Ferritin:MCnc:Pt:Ser/Plas:Qn:: 1470 — ABNORMAL HIGH

## 2017-12-30 LAB — KAPPA FREE,SERUM: Lab: 92.62 — ABNORMAL HIGH

## 2017-12-30 LAB — THYROID STIMULATING HORMONE: Thyrotropin:ACnc:Pt:Ser/Plas:Qn:: 5.687 — ABNORMAL HIGH

## 2017-12-30 LAB — PHOSPHORUS: Phosphate:MCnc:Pt:Ser/Plas:Qn:: 4.5

## 2017-12-30 LAB — TOTAL IRON BINDING CAPACITY (CALC): Lab: 155.1 — ABNORMAL LOW

## 2017-12-30 LAB — T4, FREE: FREE T4: 0.86 ng/dL (ref 0.71–1.40)

## 2017-12-30 LAB — CALCIUM: Calcium:MCnc:Pt:Ser/Plas:Qn:: 8.9

## 2017-12-30 LAB — FREE T4: Thyroxine.free:MCnc:Pt:Ser/Plas:Qn:: 0.86

## 2017-12-30 LAB — BUN / CREAT RATIO: Urea nitrogen/Creatinine:MRto:Pt:Ser/Plas:Qn:: 9

## 2017-12-30 LAB — HYPOCHROMIA

## 2017-12-30 LAB — MAGNESIUM: Magnesium:MCnc:Pt:Ser/Plas:Qn:: 1.9

## 2017-12-30 LAB — IMMUNOGLOBULIN FREE LT CHAINS BLOOD: KAPPA FREE,SERUM: 92.62 mg/dL — ABNORMAL HIGH (ref 0.33–1.94)

## 2017-12-30 LAB — TOXIC GRANULATION

## 2017-12-30 LAB — URIC ACID: Urate:MCnc:Pt:Ser/Plas:Qn:: 6.8 — ABNORMAL HIGH

## 2017-12-30 MED ORDER — COLCHICINE 0.6 MG TABLET
ORAL_TABLET | ORAL | 3 refills | 0.00000 days | Status: CP
Start: 2017-12-30 — End: 2018-04-21

## 2017-12-30 MED ORDER — IXAZOMIB 2.3 MG CAPSULE: 2 mg | capsule | 1 refills | 0 days | Status: AC

## 2017-12-30 MED ORDER — IXAZOMIB 2.3 MG CAPSULE: each | 1 refills | 0 days

## 2017-12-30 MED ORDER — TRAMADOL 50 MG TABLET
ORAL_TABLET | Freq: Three times a day (TID) | ORAL | 0 refills | 0 days | Status: CP | PRN
Start: 2017-12-30 — End: 2018-01-08

## 2017-12-30 MED ORDER — IXAZOMIB 2.3 MG CAPSULE
ORAL_CAPSULE | ORAL | 1 refills | 0.00000 days | Status: CP
Start: 2017-12-30 — End: 2018-01-27

## 2017-12-30 NOTE — Unmapped (Signed)
You can stop your antibiotics - except the valacyclovir, please continue to take that for shingles prevention.    Come back in 1 week to recheck your counts and possibly start your next cycle.    Please return in 1 weeks for a follow up visit and labs.    If you have any questions, please do not hesitate to contact Kayla Torres.    Please contact Kayla Torres for any of these symptoms:    1. New bone pain  2. Unintentional weight loss  3. Unexplained fatigue  4. Swelling of your legs  5. Yellowing of the skin or eyes    When reviewing your results, please remember that the results of many of the tests we order can vary somewhat and that variation often means nothing.  Sometimes when we get results back after your clinic visit, if it looks like there???s some variation of that type, we may decide to recheck things sooner than we discussed in clinic.  If you get a call that we want to recheck things sooner, do not panic. It does not mean that things are going wrong.    For appointments & questions Monday through Friday 8 AM??? 5 PM   please call [phone] or Toll free [phone].    On Nights, Weekends and Holidays  Call [phone] and ask for the adult hematologist/oncologist on call.    [name], AGPCNP-C, MSN, OCN  Nurse Practitioner  Hematologic Malignancies  [facility]    Nurse Navigator: Layla Maw, RN  Questions and appointments M-F 8am - 5pm: [phone] or [phone]    [facility]  [address]  [place], [place] [address]  [email or web address]    Results for orders placed or performed in visit on 12/30/17   Comprehensive Metabolic Panel   Result Value Ref Range    Sodium 144 135 - 145 mmol/L    Potassium 3.8 3.5 - 5.0 mmol/L    Chloride 108 (H) 98 - 107 mmol/L    CO2 24.0 22.0 - 30.0 mmol/L    BUN 14 7 - 21 mg/dL    Creatinine 3.66 (H) 0.60 - 1.00 mg/dL    BUN/Creatinine Ratio 9     EGFR MDRD Non Af Amer 31 (L) >=60 mL/min/1.67m2    EGFR MDRD Af Amer 38 (L) >=60 mL/min/1.26m2    Anion Gap 12 9 - 15 mmol/L    Glucose 98 65 - 179 mg/dL    Calcium 9.2 8.5 - 44.0 mg/dL    Albumin 3.7 3.5 - 5.0 g/dL    Total Protein 9.2 (H) 6.5 - 8.3 g/dL    Total Bilirubin 0.5 0.0 - 1.2 mg/dL    AST 73 (H) 14 - 38 U/L    ALT 46 15 - 48 U/L    Alkaline Phosphatase 154 (H) 38 - 126 U/L   CBC w/ Differential   Result Value Ref Range    WBC 3.4 (L) 4.5 - 11.0 10*9/L    RBC 3.36 (L) 4.00 - 5.20 10*12/L    HGB 10.4 (L) 12.0 - 16.0 g/dL    HCT 34.7 (L) 42.5 - 46.0 %    MCV 95.4 80.0 - 100.0 fL    MCH 31.0 26.0 - 34.0 pg    MCHC 32.5 31.0 - 37.0 g/dL    RDW 95.6 (H) 38.7 - 15.0 %    MPV 10.3 (H) 7.0 - 10.0 fL    Platelet 37 (L) 150 - 440 10*9/L    Neutrophils % 82.5 %  Lymphocytes % 8.5 %    Monocytes % 5.5 %    Eosinophils % 1.1 %    Basophils % 0.3 %    Absolute Neutrophils 2.8 2.0 - 7.5 10*9/L    Absolute Lymphocytes 0.3 (L) 1.5 - 5.0 10*9/L    Absolute Monocytes 0.2 0.2 - 0.8 10*9/L    Absolute Eosinophils 0.0 0.0 - 0.4 10*9/L    Absolute Basophils 0.0 0.0 - 0.1 10*9/L    Large Unstained Cells 2 0 - 4 %    Microcytosis Slight (A) Not Present    Macrocytosis Marked (A) Not Present    Anisocytosis Moderate (A) Not Present    Hypochromasia Slight (A) Not Present

## 2017-12-30 NOTE — Unmapped (Signed)
Assessment/Plan:   Kayla Torres is a 77 y.o.  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    1. Crystalline arthritis: the patient has crystal proven gout and pseudogout.  She had a steroid injection into her right knee in the hospital but didn't have much improvement with this.  Her arthritis is starting to flare again but patient does not want to have an injection today.  Instead, will start colchicine 0.6mg  MWF (for CKD) and assess response.  Based on uric acid level today, may add urate lowering therapy if needed.  Discussed that she can reach out if she changes her mind about the steroid injection.  Will also prescribe one month supply of tramadol for pain; she was instructed that she will need to get refills from her PCP.  Will also screen for metabolic causes for CPPD although suspect uric acid elevation is due to CKD/malignancy.    Kayla Torres was seen today for follow-up.    Diagnoses and all orders for this visit:    Personal history of calcium pyrophosphate deposition disease (CPPD)  -     Uric acid; Future  -     Vitamin D 25 Hydroxy (25OH D2 + D3); Future  -     Cancel: Calcium; Future  -     Phosphorus Level; Future  -     Iron & TIBC; Future  -     Transferrin; Future  -     Ferritin; Future  -     Cancel: Alkaline Phosphatase; Future  -     Magnesium Level; Future  -     TSH; Future  -     T4, Free; Future  -     PTH (Parathyroid Hormone); Future    Chronic gout of right knee, unspecified cause  -     Uric acid; Future  -     Vitamin D 25 Hydroxy (25OH D2 + D3); Future  -     Cancel: Calcium; Future  -     Phosphorus Level; Future  -     Iron & TIBC; Future  -     Transferrin; Future  -     Ferritin; Future  -     Cancel: Alkaline Phosphatase; Future  -     Magnesium Level; Future  -     TSH; Future  -     T4, Free; Future  -     PTH (Parathyroid Hormone); Future    Multiple myeloma, remission status unspecified (CMS-HCC)   -     Iron & TIBC; Future  -     Transferrin; Future  -     Ferritin; Future  -     TSH; Future  -     T4, Free; Future    Fatigue, unspecified type   -     Iron & TIBC; Future  -     Transferrin; Future  -     Ferritin; Future  -     TSH; Future  -     T4, Free; Future    Other orders  -     colchicine (COLCRYS) 0.6 mg tablet; Take 1 tablet (0.6 mg total) by mouth Every Monday, Wednesday, and Friday.  -     traMADol (ULTRAM) 50 mg tablet; Take 0.5 tablets (25 mg total) by mouth every eight (8)  hours as needed for pain.        RTC in 4-6 months or sooner prn.      Patient was seen and examined with attending physician, Dr. Cheryl Flash, MD  Rheumatology Fellow  Pager [phone]    Primary Care Provider: Leone Payor    CC: right knee pain    HPI:  Kayla Torres is a 77 y.o.  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    As a summary, the patient was first evaluated while inpatient for dry gangrene in January 2019.  After surgery, she developed right knee swelling, pain, tenderness that she states she had not had before.  Second joint aspiration confirmed monosodium urate and CPPD crystals.  She had a steroid injection into the knee once joint cultures were negative.    The patient states that since going home, she hasn't felt especially well.  She is tired and feels achy all over.  She has started a new chemotherapy regimen since discharge.  She also reports poor appetite.  She states that the pain is all over.  Her right foot is feeling well, she is ambulating without significant pain in the foot.  She does continue to have pain in her right knee.  She reports some swelling.  She states that the steroid shot didn't significantly help her symptoms.  She states that her knee pain had gotten better but now is starting to get worse again.  She states that she has had problems with that knee for years.  She also has pain in her right shoulder and has a hard time raising up her arm.  No history of kidney stones.  No fevers, cough, chest pain, sicca symptoms, no rashes.  She has had some nausea since starting the new chemotherapy.    Review of Systems:  + as above, otherwise all other systems reviewed are negative.     Medical History:  Problem List Items Addressed This Visit     None          Past Medical History:   Diagnosis Date   ??? Arthritis    ??? High cholesterol    ??? Hypertension    ??? Multiple myeloma (CMS-HCC) 01/18/2014       Allergies:  Patient has no known allergies.    Medications:     Current Outpatient Prescriptions:   ???  acetaminophen (TYLENOL) 325 MG tablet, Take 2 tablets (650 mg total) by mouth every six (6) hours as needed., Disp: 30 tablet, Rfl: 0  ???  alendronate (FOSAMAX) 70 MG tablet, Take 1 tablet by mouth once a week. Takes on Tuesday AM, Disp: , Rfl:   ???  amLODIPine (NORVASC) 5 MG tablet, Take 5 mg by mouth daily. , Disp: , Rfl:   ???  clopidogrel (PLAVIX) 75 mg tablet, Take 1 tablet (75 mg total) by mouth daily., Disp: 30 tablet, Rfl: 1  ???  fluconazole (DIFLUCAN) 200 MG tablet, Take 1 tablet (200 mg total) by mouth daily., Disp: 30 tablet, Rfl: 0  ???  ixazomib (NINLARO) 2.3 mg capsule, Take 1 capsule (2.3 mg total) by mouth every seven (7) days. Take at least one hour before or two hours after food. Take days 1, 8, 15., Disp: 3 capsule, Rfl: 1  ???  losartan (COZAAR) 50 MG tablet, , Disp: , Rfl:   ???  magnesium oxide (MAG-OX) 400 mg tablet, Take 1 tablet (400 mg total) by mouth Two (2) times a day., Disp: 120 tablet, Rfl: 2  ???  melphalan (ALKERAN) 2 mg tablet, Take 5 tablets (10 mg total) by mouth Take as directed. Take 5 tabs days 1-4 of a 28-day cycle., Disp: 20 tablet, Rfl: 0  ???  metoprolol tartrate (LOPRESSOR) 25 MG tablet, Take 0.5 tablets (12.5 mg total) by mouth Two (2) times a day., Disp: 30 tablet, Rfl: 0  ???  mirtazapine (REMERON) 45 MG tablet, Take 1 tablet (45 mg total) by mouth nightly., Disp: 30 tablet, Rfl: 6  ???  ondansetron (ZOFRAN-ODT) 4 MG disintegrating tablet, Take 1 tablet (4 mg total) by mouth every eight (8) hours as needed for nausea., Disp: 60 tablet, Rfl: 3  ???  oxyCODONE (ROXICODONE) 5 MG immediate release tablet, , Disp: , Rfl:   ???  polyethylene glycol (MIRALAX) 17 gram packet, Take 17 g by mouth daily as needed., Disp: 10 packet, Rfl: 0  ???  potassium chloride (KLOR-CON) 10 MEQ CR tablet, Take 2 tablets (20 mEq total) by mouth daily., Disp: 30 tablet, Rfl: 11  ???  predniSONE (DELTASONE) 50 MG tablet, Take 2 tablets (100 mg total) by mouth daily. Days 1-4 of a 28-day cycle, Disp: 8 tablet, Rfl: 1  ???  senna (SENOKOT) 8.6 mg tablet, Take 1 tablet by mouth nightly as needed for constipation., Disp: 10 tablet, Rfl: 0  ???  silver sulfaDIAZINE (SILVADENE) 1 % cream, Apply topically daily., Disp: 100 g, Rfl: 2  ???  sodium hypochlorite (DAKIN'S, HALF-STRENGTH,) external solution, Apply topically daily. Use as a wound cleanser, pat dry, then apply dressing, Disp: 473 mL, Rfl: 1  ???  telmisartan (MICARDIS) 40 MG tablet, Take 40 mg by mouth daily., Disp: , Rfl:   ???  valACYclovir (VALTREX) 500 MG tablet, Take 1 tablet (500 mg total) by mouth daily., Disp: 30 tablet, Rfl: 6  ???  zinc gluconate 100 mg Tab, Take 100 mg by mouth daily at 0600., Disp: 30 each, Rfl: 11    Surgical History:  Past Surgical History:   Procedure Laterality Date   ??? negative surgical history     ??? PR AMPUTATION TOE,MT-P JT Right 10/24/2017    Procedure: AMPUTATION, TOE; METATARSOPHALANGEAL JOINT;  Surgeon: Boykin Reaper, MD;  Location: MAIN OR [facility];  Service: Vascular       Social History:  Social History   Substance Use Topics   ??? Smoking status: Former Smoker     Packs/day: 0.50     Quit date: 01/17/2014   ??? Smokeless tobacco: Never Used   ??? Alcohol use No       Family History:  Family History   Problem Relation Age of Onset   ??? Hypertension Mother    ??? Hypertension Sister    ??? Hypertension Brother        Objective   Vitals:    12/30/17 1209   BP: 139/76   BP Site: L Arm   BP Position: Sitting   BP Cuff Size: Medium   Pulse: 81   Temp: 35.3 ??C   TempSrc: Oral   Weight: 59.1 kg (130 lb 4.7 oz)       Physical Exam  General: well appearing, no acute distress  Eyes: PERRLA, EOMI, sclera anicteric  ENT: MMM.  Oropharynx without any erythema or exudate.  No oropharyngeal exudates or ulcerations  Neck: supple, no JVD.  No cervical lymphadenopathy  Cardiovascular: Normal Rate, Normal  Heart Sounds, Normal Rhythm and No Murmurs  Pulmonary:Normal Breath Sounds Bilaterally and No chest Tenderness  ZO:XWRU, nontender, positive bowel sounds  Skin: no rash, lesions, breakdown. Normal turgor and elasticity.    No Raynaud???s phenomenon or digital ulcers.  Extremities: Warm and well perfused, no cyanosis, clubbing or edema.  Musculoskeletal: No swelling/tenderness/restricted range of motion in small joints of hands, wrists, shoulders, elbows.  Left knee within normal limits.  Right knee slightly warm with effusion, crepitus and pain with active/passive ROM.  4th and 5th right toe amputation.  Neurologic: Cranial nerves II-XII grossly intact, strength 5/5 bilaterally.  Normal sensation  Psychiatric: Normal mood and affect.      Test Results  Lab on 12/30/2017   Component Date Value   ??? Sodium 12/30/2017 144    ??? Potassium 12/30/2017 3.8    ??? Chloride 12/30/2017 108*   ??? CO2 12/30/2017 24.0    ??? BUN 12/30/2017 14    ??? Creatinine 12/30/2017 1.62*   ??? BUN/Creatinine Ratio 12/30/2017 9    ??? EGFR MDRD Non Af Amer 12/30/2017 31*   ??? EGFR MDRD Af Amer 12/30/2017 38*   ??? Anion Gap 12/30/2017 12    ??? Glucose 12/30/2017 98    ??? Calcium 12/30/2017 9.2    ??? Albumin 12/30/2017 3.7    ??? Total Protein 12/30/2017 9.2*   ??? Total Bilirubin 12/30/2017 0.5    ??? AST 12/30/2017 73*   ??? ALT 12/30/2017 46    ??? Alkaline Phosphatase 12/30/2017 154*   ??? WBC 12/30/2017 3.4* ??? RBC 12/30/2017 3.36*   ??? HGB 12/30/2017 10.4*   ??? HCT 12/30/2017 32.0*   ??? MCV 12/30/2017 95.4    ??? MCH 12/30/2017 31.0    ??? MCHC 12/30/2017 32.5    ??? RDW 12/30/2017 21.9*   ??? MPV 12/30/2017 10.3*   ??? Platelet 12/30/2017 37*   ??? Neutrophils % 12/30/2017 82.5    ??? Lymphocytes % 12/30/2017 8.5    ??? Monocytes % 12/30/2017 5.5    ??? Eosinophils % 12/30/2017 1.1    ??? Basophils % 12/30/2017 0.3    ??? Absolute Neutrophils 12/30/2017 2.8    ??? Absolute Lymphocytes 12/30/2017 0.3*   ??? Absolute Monocytes 12/30/2017 0.2    ??? Absolute Eosinophils 12/30/2017 0.0    ??? Absolute Basophils 12/30/2017 0.0    ??? Large Unstained Cells 12/30/2017 2    ??? Microcytosis 12/30/2017 Slight*   ??? Macrocytosis 12/30/2017 Marked*   ??? Anisocytosis 12/30/2017 Moderate*   ??? Hypochromasia 12/30/2017 Slight*   ??? Smear Review Comments 12/30/2017 See Comment*   ??? Rouleaux 12/30/2017 Present*   ??? Toxic Granulation 12/30/2017 Present*   Office Visit on 12/29/2017   Component Date Value   ??? Aerobic Culture 12/29/2017 NO GROWTH TO DATE    ??? Gram Stain Result 12/29/2017 No polymorphonuclear leukocytes seen    ??? Gram Stain Result 12/29/2017 No organisms seen    Hospital Outpatient Visit on 12/16/2017   Component Date Value   ??? Crossmatch 12/17/2017 Compatible    ??? Unit Blood Type 12/17/2017 O Pos    ??? ISBT Number 12/17/2017 [id number]    ??? Unit # 12/17/2017 [id number]    ??? Status 12/17/2017 Transfused    ??? Product ID 12/17/2017 Red Blood Cells    ??? PRODUCT CODE 12/17/2017 [id number]    ??? Crossmatch 12/17/2017 Compatible    ??? Unit Blood Type 12/17/2017 O Pos    ??? ISBT Number 12/17/2017 [id number]    ??? Unit # 12/17/2017  [id number]    ??? Status 12/17/2017 Transfused    ??? Product ID 12/17/2017 Red Blood Cells    ??? PRODUCT CODE 12/17/2017 [id number]    Office Visit on 12/16/2017   Component Date Value   ??? ABO Grouping 12/16/2017 O POS    ??? Antibody Screen 12/16/2017 NEG    Lab on 12/16/2017   Component Date Value   ??? Sodium 12/16/2017 143    ??? Potassium 12/16/2017 3.4*   ??? Chloride 12/16/2017 108*   ??? CO2 12/16/2017 26.0    ??? BUN 12/16/2017 10    ??? Creatinine 12/16/2017 1.23*   ??? BUN/Creatinine Ratio 12/16/2017 8    ??? EGFR MDRD Non Af Amer 12/16/2017 43*   ??? EGFR MDRD Af Amer 12/16/2017 52*   ??? Anion Gap 12/16/2017 9    ??? Glucose 12/16/2017 105    ??? Calcium 12/16/2017 8.7    ??? Albumin 12/16/2017 3.3*   ??? Total Protein 12/16/2017 8.0    ??? Total Bilirubin 12/16/2017 0.3    ??? AST 12/16/2017 18    ??? ALT 12/16/2017 21    ??? Alkaline Phosphatase 12/16/2017 68    ??? Kappa Free, Serum 12/16/2017 58.07*   ??? Lambda Free, Serum 12/16/2017 <0.25*   ??? K/L FLC Ratio 12/16/2017     ??? WBC 12/16/2017 0.4*   ??? RBC 12/16/2017 2.33*   ??? HGB 12/16/2017 7.1*   ??? HCT 12/16/2017 21.9*   ??? MCV 12/16/2017 93.8    ??? [facility] 12/16/2017 30.2    ??? MCHC 12/16/2017 32.2    ??? RDW 12/16/2017 21.4*   ??? MPV 12/16/2017 9.3    ??? Platelet 12/16/2017 66*   ??? Neutrophils % 12/16/2017 39.7    ??? Lymphocytes % 12/16/2017 35.7    ??? Monocytes % 12/16/2017 10.1    ??? Eosinophils % 12/16/2017 5.4    ??? Basophils % 12/16/2017 0.3    ??? Absolute Neutrophils 12/16/2017 0.1*   ??? Absolute Lymphocytes 12/16/2017 0.1*   ??? Absolute Monocytes 12/16/2017 0.0*   ??? Absolute Eosinophils 12/16/2017 0.0    ??? Absolute Basophils 12/16/2017 0.0    ??? Large Unstained Cells 12/16/2017 9*   ??? Microcytosis 12/16/2017 Slight*   ??? Macrocytosis 12/16/2017 Moderate*   ??? Anisocytosis 12/16/2017 Moderate*   ??? Hypochromasia 12/16/2017 Slight*   ??? ALBUMIN (SPE) 12/16/2017 3.2*   ??? ALPHA-1 GLOBULIN 12/16/2017 0.4    ??? ALPHA-2 GLOBULIN 12/16/2017 0.8    ??? BETA-1 GLOBULIN 12/16/2017 0.1*   ??? BETA-2 GLOBULIN 12/16/2017 0.2    ??? GAMMAGLOBULIN 12/16/2017 3.8*   ??? M Spike 12/16/2017 3.5*   ??? SPE Interpretation 12/16/2017     ??? Immunofixation Electroph* 12/16/2017     ??? Total Protein 12/16/2017 8.5*   ??? Total Protein 12/16/2017 8.5*   ??? Total IgG 12/16/2017 4545*   ??? IgM 12/16/2017 <25*   ??? IgA 12/16/2017 12.0*   ??? Smear Review Comments 12/16/2017 See Comment*   ??? Rouleaux 12/16/2017 Present*   Appointment on 12/03/2017   Component Date Value   ??? Kappa Free, Serum 12/03/2017 96.19*   ??? Lambda Free, Serum 12/03/2017 <0.25*   ??? K/L FLC Ratio 12/03/2017     ??? Sodium 12/03/2017 143    ??? Potassium 12/03/2017 4.0    ??? Chloride 12/03/2017 111*   ??? CO2 12/03/2017 25.0    ??? BUN 12/03/2017 8    ??? Creatinine 12/03/2017 1.40*   ??? BUN/Creatinine Ratio 12/03/2017 6    ??? EGFR MDRD Non Af  Amer 12/03/2017 37*   ??? EGFR MDRD Af Amer 12/03/2017 44*   ??? Anion Gap 12/03/2017 7*   ??? Glucose 12/03/2017 88    ??? Calcium 12/03/2017 9.4    ??? Albumin 12/03/2017 3.5    ??? Total Protein 12/03/2017 8.9*   ??? Total Bilirubin 12/03/2017 0.4    ??? AST 12/03/2017 17    ??? ALT 12/03/2017 12*   ??? Alkaline Phosphatase 12/03/2017 77    ??? WBC 12/03/2017 3.0*   ??? RBC 12/03/2017 2.76*   ??? HGB 12/03/2017 8.3*   ??? HCT 12/03/2017 25.6*   ??? MCV 12/03/2017 92.6    ??? [facility] 12/03/2017 30.1    ??? MCHC 12/03/2017 32.5    ??? RDW 12/03/2017 19.4*   ??? MPV 12/03/2017 8.9    ??? Platelet 12/03/2017 141*   ??? Neutrophils % 12/03/2017 70.4    ??? Lymphocytes % 12/03/2017 14.7    ??? Monocytes % 12/03/2017 6.8    ??? Eosinophils % 12/03/2017 3.1    ??? Basophils % 12/03/2017 0.4    ??? Absolute Neutrophils 12/03/2017 2.1    ??? Absolute Lymphocytes 12/03/2017 0.5*   ??? Absolute Monocytes 12/03/2017 0.2    ??? Absolute Eosinophils 12/03/2017 0.1    ??? Absolute Basophils 12/03/2017 0.0    ??? Large Unstained Cells 12/03/2017 5*   ??? Microcytosis 12/03/2017 Slight*   ??? Macrocytosis 12/03/2017 Slight*   ??? Anisocytosis 12/03/2017 Moderate*   ??? Hypochromasia 12/03/2017 Moderate*   ??? ALBUMIN (SPE) 12/03/2017 2.9*   ??? ALPHA-1 GLOBULIN 12/03/2017 0.4    ??? ALPHA-2 GLOBULIN 12/03/2017 0.8    ??? BETA-1 GLOBULIN 12/03/2017 0.3    ??? BETA-2 GLOBULIN 12/03/2017 4.0*   ??? GAMMAGLOBULIN 12/03/2017 0.1*   ??? M Spike 12/03/2017 3.8*   ??? SPE Interpretation 12/03/2017     ??? Immunofixation Electroph* 12/03/2017     ??? Total Protein 12/03/2017 8.5*   ??? Total Protein 12/03/2017 8.5*   ??? Total IgG 12/03/2017 5341*   ??? IgM 12/03/2017 <25*   ??? IgA 12/03/2017 16.2*   ??? Smear Review Comments 12/03/2017 See Comment*   ??? Rouleaux 12/03/2017 Present*

## 2017-12-30 NOTE — Unmapped (Signed)
[facility] Follow Up      REASON FOR VISIT: Kayla Torres presents today for follow-up of multiple myeloma, now on Mel/Ixa/Pred.    ASSESSMENT:  #1 IgG kappa symptomatic multiple myeloma - ISS Stage II    #2 CKD, Stage 3B, moderate (CrCl estimated 32.1 mL/min).  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  She tolerated cycle 1 of Mel/Ixa/Pred well, though did have an ANC of 0.1 on day 14.  She did not have any fevers or infections and got Neulasta that day.  Today, her ANC and Hgb have recovered nicely, though her platelet count is 37k.  Per pharmacy, platelets should be 75k for Ixa initiation and 100k for melphalan.  Request she return to clinic in 1 week for labs and to potentially start cycle 2.    Does not need to restart antibiotics at this point with ANC 2.8.    Neuropathy continues to be stable - not very noticable. She has not noticed any changes.  Reports intermittent tingling in hands that is resolved with one mustard packet prn.        RECOMMENDATIONS:  1. Continue Melphalan/Ixazomib/Pred   Ixazomib 2.3 mg po days 1, 8, 15 (dose reduced for myelosuppression)   Melphalan 12m/m2 (10mg ) po days 1-4   Prednisone 60mg /m2 (100mg ) po days 1-4   1 cycle = 28 days  2. Prophylaxis    VZV ppx: Valacyclovir  3. Can stop levo and fluc since ANC > 1  4. RTC 1 weeks for consideration of C2D1    [name], AGPCNP-C, MSN, OCN  Nurse Practitioner  Hematologic Malignancies  [facility]  [phone] (phone)  [phone] (fax)  [email or web address]        ONCOLOGY HISTORY:  Oncology History    Diagnosis:  Symptomatic IgG kappa multiple myeloma, 01/11/14.  Ms. Hoffmann was diagnosed with symptomatic myeloma at the age of 77 year old when she presented with symptomatic hypercalcemia.  Evaluation revealed IgG kappa symptomatic multiple myeloma based upon SPEP M-protein of 2.5 g/dL, anemia, hypercalcemia, and lytic lesions documented on skeletal survey. Bone marrow biopsy performed 01/11/14 showed normocellular bone marrow (60%) with involvement by plasma cell dyscrasia (35% plasma cells by aspirate differential, 80% plasma cells by CD138 immunohistochemical analysis of clot, and monotypic kappa by in-situ hybridization).  IgG elevated at 3208 mg/dL, IgM <29, IgA 562.     Stage @ Diagnosis: ISS 2 (B2M 4.11).   ISS I (B2M <3.5 mg/dL and albumin >1.3 g/dL) - Median OS 62 months  ISS II - neither I nor III. - Median OS 44 months  ISS III - B2M >5.5 mg/dL. - Median OS 29 months      Risk Stratification:  High risk based upon +1q  Cytogenetics:    Normal karyotype: 46,XY,inv(9)(p11q13)c[20]   Abnormal FISH: A multiple myeloma FISH panel, on a CD138+ plasma cell-enriched fraction, performed at [facility]) was abnormal. A percentage of the cells were positive for three 1q signals, monosomy 13, and trisomy 9 and 15. Gain of 1q is associated with a worse prognosis in myeloma patients.     LDH: normal at diagnosis    Treatment History/Disease Course:  1. CyBorD - x 9 cycles. VGPR, best response.  2. Lenalidomide maintenance - 10/2014 - n/v/malaise -->  AKI. Subsequently changed to bortezomib q2week maintenance --> PD 07/25/15, with M-spike increased from TLTQ to 2.0 g/dL.    3. Carfilzomib 36mg /m2 + dexamethasone  TTE 07/2015 with normal EF. Addition of revlimid 15 mg 21/28 day cycle on C2. Progression 02/27/16  4. Daratumumab initiated 03/05/16 - PD  5. Cy/Pom/Dex - 04/16/16  6. Benda/Pom/Dex 03/10/17        Bone Health:  - Myeloma survey from 01/09/14: Lateral views of the calvarium show multiple small lytic lesions throughout calvarium. There is diffuse osteopenia throughout the spine. A severe compression fracture at L1 includes mild loss of the posterior vertebral body height but no listhesis. There is also endplate invagination superiorly and inferiorly at L5.  AP views of the appendicular skeleton again show diffuse osteopenia. Lytic lesions are present in the proximal and distal right femur, proximal left femur and left proximal humerus. No pathologic fracture is seen at this time.  - Bisphosphonate:  Received pamidronate in 12/2013 while hospitalized.  Starting monthly zoledronic acid 4mg  IV monthly x 2 years on 02/22/14.    Transplant Status:  Not transplanted; had initial visit with Dr. Lucretia Roers on 04/03/14. Elected to forgo cell collection or transplant.         Multiple myeloma in relapse (CMS-HCC)    01/11/2014 Initial Diagnosis     Multiple myeloma, IgG kappa.  ISS 2.  Presented with hypercalcemia and anemia.  Renal failure corrected after IV hydration.  M-spike 2.5 g/dL         10/20/9145 -  Chemotherapy     CyBorD         02/22/2014 Endocrine/Hormone Therapy     Zometa Therapy 4 mg IV monthly. Plan for two years of therapy         10/27/2014 -  Chemotherapy     Bortezomib maintenance 1.3 mg/m2 [place] q2 weeks         07/25/2015 Progression     M-spike 2.0.  CKD stable, cr 1.45 mg/dL.  Hgb 10.6, slightly reduced from steady-state.         08/08/2015 - 02/27/2016 Chemotherapy     Carfilzomib + dexamethaonse with addition of revlimid 15 mg 21/28 day cycle on C2. 10/03/15 C3 revlimid discontinued after one cycle (C2). C5 delayed a week for URI.         02/17/2016 Progression            02/27/2016 Progression     M-spike to 2.3         03/05/2016 -  Chemotherapy     Single agent Daratumumab         04/14/2016 Progression     M-spike 3.3 up from 2.3 on single agent dara         04/15/2016 -  Chemotherapy     Initiated pom/cy/dex; Pom 4mg  d1-21, Cytoxan 400mg  PO days 1, 8, 15, dex 20mg  PO days 1, 8, 15, 22         05/05/2016 -  Chemotherapy     Dose reduced pomalidomide to 2mg  d/t AKI         03/10/2017 -  Chemotherapy     Benda/Pom/Dex         10/09/2017 Progression     M-spike 1.4. Initiation of treatment delayed by gangrene of right 4th and 5th toes, with hospitalization            Chemotherapy     Ixazomib/Melphalan/Prednisone  M-spike 3.4  INTERVAL HISTORY:  Kayla Torres presents to clinic today with her daughter for follow up.  She reports this morning that she is doing not well, and is sore all over.  Most specifically, she is having left shoulder pain, along with chronic knee pain.  She's taking tylenol for her shoulder pain.   No diarrhea.  Her right foot is healing well, following with Vascular surgery.  No pain in her foot.  Up at home.  Appetite is good.    Otherwise, denies new bone pain, fevers, chills, night sweats, lumps/bumps, tongue swelling, shortness of breath, syncope, lightheadedness, constipation, nausea or vomiting, very easy bruising or bleeding, or urinary changes.         REVIEW OF SYSTEMS:    10-systems otherwise reviewed and negative except as per Interval History.    PAST MEDICAL HISTORY:    Past Medical History:   Diagnosis Date   ??? Arthritis    ??? High cholesterol    ??? Hypertension    ??? Multiple myeloma (CMS-HCC) 01/18/2014         PAST SURGICAL HISTORY:    Past Surgical History:   Procedure Laterality Date   ??? negative surgical history     ??? PR AMPUTATION TOE,MT-P JT Right 10/24/2017    Procedure: AMPUTATION, TOE; METATARSOPHALANGEAL JOINT;  Surgeon: Boykin Reaper, MD;  Location: MAIN OR [facility];  Service: Vascular         ALLERGIES: No Known Allergies    MEDICATIONS:    Current Outpatient Prescriptions   Medication Sig Dispense Refill   ??? acetaminophen (TYLENOL) 325 MG tablet Take 2 tablets (650 mg total) by mouth every six (6) hours as needed. 30 tablet 0   ??? alendronate (FOSAMAX) 70 MG tablet Take 1 tablet by mouth once a week. Takes on Tuesday AM     ??? amLODIPine (NORVASC) 5 MG tablet Take 5 mg by mouth daily.      ??? clopidogrel (PLAVIX) 75 mg tablet Take 1 tablet (75 mg total) by mouth daily. 30 tablet 1   ??? fluconazole (DIFLUCAN) 200 MG tablet Take 1 tablet (200 mg total) by mouth daily. 30 tablet 0   ??? ixazomib (NINLARO) 4 mg capsule Take 1 capsule (4 mg total) by mouth every seven (7) days. Take at least 1 hour before or at least 1 hours after food. Take days 1, 8, 15. 3 capsule 1   ??? losartan (COZAAR) 50 MG tablet      ??? magnesium oxide (MAG-OX) 400 mg tablet Take 1 tablet (400 mg total) by mouth Two (2) times a day. 120 tablet 2   ??? melphalan (ALKERAN) 2 mg tablet Take 5 tablets (10 mg total) by mouth Take as directed. Take 5 tabs days 1-4 of a 28-day cycle. 20 tablet 0   ??? metoprolol tartrate (LOPRESSOR) 25 MG tablet Take 0.5 tablets (12.5 mg total) by mouth Two (2) times a day. 30 tablet 0   ??? mirtazapine (REMERON) 45 MG tablet Take 1 tablet (45 mg total) by mouth nightly. 30 tablet 6   ??? ondansetron (ZOFRAN-ODT) 4 MG disintegrating tablet Take 1 tablet (4 mg total) by mouth every eight (8) hours as needed for nausea. 60 tablet 3   ??? oxyCODONE (ROXICODONE) 5 MG immediate release tablet      ??? polyethylene glycol (MIRALAX) 17 gram packet Take 17 g by mouth daily as needed. 10 packet 0   ??? potassium chloride (KLOR-CON) 10 MEQ CR tablet Take 2 tablets (20 mEq total)  by mouth daily. 30 tablet 11   ??? predniSONE (DELTASONE) 50 MG tablet Take 2 tablets (100 mg total) by mouth daily. Days 1-4 of a 28-day cycle 8 tablet 1   ??? senna (SENOKOT) 8.6 mg tablet Take 1 tablet by mouth nightly as needed for constipation. 10 tablet 0   ??? silver sulfaDIAZINE (SILVADENE) 1 % cream Apply topically daily. 100 g 2   ??? sodium hypochlorite (DAKIN'S, HALF-STRENGTH,) external solution Apply topically daily. Use as a wound cleanser, pat dry, then apply dressing 473 mL 1   ??? telmisartan (MICARDIS) 40 MG tablet Take 40 mg by mouth daily.     ??? valACYclovir (VALTREX) 500 MG tablet Take 1 tablet (500 mg total) by mouth daily. 30 tablet 6   ??? zinc gluconate 100 mg Tab Take 100 mg by mouth daily at 0600. 30 each 11     No current facility-administered medications for this visit.          PHYSICAL EXAMINATION:  VITALS:   There were no vitals filed for this visit.    ECOG PERFORMANCE STATUS: 1    GENERAL:No acute distress. She is ambulatory without assistive devices today.  She is accompanied by her daughter, Kayla Torres.  HEENT: Anicteric sclera. No conjunctival pallor. No oropharyngeal exudates, erythema, nor ulceration.  Dentures are poorly fitted and affect her speech.  LYMPH: No cervical, supraclavicular, nor axillary adenopathy.  CV: S1,S2, no murmurs, rubs, nor gallops.    LUNGS: Speaking comfortably in full sentences without increased work of breathing. Clear to auscultation bilaterally.  ABDOMEN: Soft, non-tender, and non-distended. Normoactive bowel sounds. No hepatosplenomegaly.  EXTREMITIES: No edema.    LABS/IMAGING/OTHER TESTS:

## 2017-12-30 NOTE — Unmapped (Signed)
It was a pleasure to see you today!  If there are any questions or concerns after you leave the office, you can send me a mychart message or call the clinic at [phone].  It could take up to three weeks for all of the results to be back.    Plan for today:  I will see you back in 3 months            colchicine  Pronunciation:  KOL chi seen  Brand:  Colcrys, Mitigare  What is the most important information I should know about colchicine?  Serious drug interactions can occur when certain medicines are used together with colchicine. Tell each of your healthcare providers about all medicines you use now, and any medicine you start or stop using.  What is colchicine?  Colchicine affects the way the body responds to uric acid crystals, which reduces swelling and pain.  Because colchicine was developed prior to federal regulations requiring FDA review of all marketed drug products, not all uses for colchicine have been approved by the FDA.  The Colcrys  brand of colchicine is FDA-approved to treat or prevent gout in adults, and to treat a genetic condition called Familial Mediterranean Fever in adults and children who are at least 59 years old.  The Mitigare brand of colchicine is FDA-approved to prevent gout flares in adults.  Generic forms of colchicine have been used to treat or prevent attacks of gout, or to treat symptoms of Behcets syndrome (such as swelling, redness, warmth, and pain).  Colchicine is not a cure for gouty arthritis or Behcets syndrome, and it will not prevent these diseases from progressing. Colchicine should not be used as a routine pain medication for other conditions.  Colchicine may also be used for purposes not listed in this medication guide.  What should I discuss with my healthcare provider before taking colchicine?  You should not use colchicine if you are allergic to it.  Some medicines can cause unwanted or dangerous effects when used with colchicine, especially if you have liver or kidney disease. Your doctor may need to change your treatment plan if you use any of the following drugs:  ?? cyclosporine;  ?? nefazodone;  ?? tipranavir;  ?? clarithromycin or telithromycin;  ?? itraconazole or ketoconazole; or  ?? HIV or AIDS medicine --atazanavir, darunavir, fosamprenavir, indinavir, lopinavir, nelfinavir, ritonavir, or saquinavir.  To make sure colchicine is safe for you, tell your doctor if you have:  ?? liver disease;  ?? kidney disease; or  ?? if you take digoxin, or cholesterol-lowering medications.  It is not known whether this medicine will harm an unborn baby. Tell your doctor if you are pregnant or plan to become pregnant.  Colchicine can pass into breast milk and may harm a nursing baby. Tell your doctor if you are breast-feeding a baby.  How should I take colchicine?  Do not purchase colchicine on the Internet or from vendors outside of the [place]. Using this medication improperly or without the advice of a doctor can result in serious side effects or death.  Follow all directions on your prescription label. Do not take this medicine in larger or smaller amounts or for longer than recommended.  Colchicine can be taken with or without food.  To treat a gout attack, for best results take colchicine at the first sign of the attack. The longer you wait to start taking the medication, the less effective it may be.  You may need to take  a second lower dose of colchicine 1 hour after the first dose if you still have gout pain. Follow your doctor's instructions.  Your dose will depend on the reason you are taking this medicine.  Colchicine doses for gout and Mediterranean fever are different.  Do not stop using colchicine unless your doctor tells you to, even if you feel fine.  Call your doctor if your symptoms do not improve, or if they get worse.  If you use this medicine long-term, you may need frequent medical tests.  Store at room temperature away from moisture, heat, and light. Keep the bottle tightly closed when not in use.  What happens if I miss a dose?  Take the missed dose as soon as you remember. Skip the missed dose if it is almost time for your next scheduled dose. Do not take extra medicine to make up the missed dose.  What happens if I overdose?  Seek emergency medical attention or call the Poison Help line at [phone]. An overdose of colchicine can be fatal.  Overdose symptoms may include diarrhea, nausea, vomiting, stomach pain, muscle weakness, little or no urinating, numbness or tingling, weak pulse, slow heart rate, weak or shallow breathing, or fainting.  What should I avoid while taking colchicine?  Grapefruit and grapefruit juice may interact with colchicine and lead to unwanted side effects. Avoid the use of grapefruit products while taking colchicine.  What are the possible side effects of colchicine?  Get emergency medical help if you have signs of an allergic reaction: hives; difficult breathing; swelling of your face, lips, tongue, or throat.  Call your doctor at once if you have:  ?? muscle pain or weakness;  ?? numbness or tingly feeling in your fingers or toes;  ?? pale or gray appearance of your lips, tongue, or hands;  ?? severe or ongoing vomiting or diarrhea;  ?? fever, chills, body aches, flu symptoms; or  ?? easy bruising, unusual bleeding, feeling weak or tired.  Common side effects may include:  ?? nausea, vomiting, stomach pain; or  ?? diarrhea.  This is not a complete list of side effects and others may occur. Call your doctor for medical advice about side effects. You may report side effects to FDA at 1-800-FDA-1088.  What other drugs will affect colchicine?  Many drugs can interact with colchicine, and some drugs should not be used together. This includes prescription and over-the-counter medicines, vitamins, and herbal products. Not all possible interactions are listed in this medication guide.  Tell your doctor about all medicines you use, and those you start or stop using during your treatment with colchicine. Give a list of all your medicines to any healthcare provider who treats you.  Where can I get more information?  Your pharmacist can provide more information about colchicine.    Remember, keep this and all other medicines out of the reach of children, never share your medicines with others, and use this medication only for the indication prescribed.  Every effort has been made to ensure that the information provided by [employer] ('[employer]') is accurate, up-to-date, and complete, but no guarantee is made to that effect. Drug information contained herein may be time sensitive. [employer] information has been compiled for use by healthcare practitioners and consumers in the [place] and therefore [employer] does not warrant that uses outside of the [place] are appropriate, unless specifically indicated otherwise. [employer]'s drug information does not endorse drugs, diagnose patients or recommend therapy. [employer]'s drug information is  an Armed forces technical officer designed to assist licensed healthcare practitioners in caring for their patients and/or to serve consumers viewing this service as a supplement to, and not a substitute for, the expertise, skill, knowledge and judgment of healthcare practitioners. The absence of a warning for a given drug or drug combination in no way should be construed to indicate that the drug or drug combination is safe, effective or appropriate for any given patient. [employer] does not assume any responsibility for any aspect of healthcare administered with the aid of information [employer] provides. The information contained herein is not intended to cover all possible uses, directions, precautions, warnings, drug interactions, allergic reactions, or adverse effects. If you have questions about the drugs you are taking, check with your doctor, nurse or pharmacist.  Copyright [phone] [employer] Version: 7.02. Revision date: 01/11/2015.  Care instructions adapted under license by [facility]. If you have questions about a medical condition or this instruction, always ask your healthcare professional. [employer] disclaims any warranty or liability for your use of this information.

## 2017-12-30 NOTE — Unmapped (Signed)
Labs drawn per piv and sent for analysis

## 2017-12-31 LAB — VITAMIN D, TOTAL (25OH): Lab: 13.8 — ABNORMAL LOW

## 2018-01-08 ENCOUNTER — Ambulatory Visit: Admit: 2018-01-08 | Discharge: 2018-01-08 | Payer: MEDICARE | Attending: Internal Medicine | Primary: Internal Medicine

## 2018-01-08 ENCOUNTER — Encounter: Admit: 2018-01-08 | Discharge: 2018-01-08 | Payer: MEDICARE

## 2018-01-08 DIAGNOSIS — D801 Nonfamilial hypogammaglobulinemia: Secondary | ICD-10-CM

## 2018-01-08 DIAGNOSIS — C9002 Multiple myeloma in relapse: Principal | ICD-10-CM

## 2018-01-08 DIAGNOSIS — D61810 Antineoplastic chemotherapy induced pancytopenia: Secondary | ICD-10-CM

## 2018-01-08 DIAGNOSIS — T451X5A Adverse effect of antineoplastic and immunosuppressive drugs, initial encounter: Secondary | ICD-10-CM

## 2018-01-08 DIAGNOSIS — N183 Chronic kidney disease, stage 3 (moderate): Secondary | ICD-10-CM

## 2018-01-08 LAB — COMPREHENSIVE METABOLIC PANEL
ALKALINE PHOSPHATASE: 144 U/L — ABNORMAL HIGH (ref 38–126)
ALT (SGPT): 16 U/L (ref 15–48)
ANION GAP: 9 mmol/L (ref 9–15)
AST (SGOT): 24 U/L (ref 14–38)
BILIRUBIN TOTAL: 0.4 mg/dL (ref 0.0–1.2)
BLOOD UREA NITROGEN: 11 mg/dL (ref 7–21)
BUN / CREAT RATIO: 6
CALCIUM: 9.1 mg/dL (ref 8.5–10.2)
CHLORIDE: 109 mmol/L — ABNORMAL HIGH (ref 98–107)
CO2: 23 mmol/L (ref 22.0–30.0)
CREATININE: 1.77 mg/dL — ABNORMAL HIGH (ref 0.60–1.00)
EGFR MDRD AF AMER: 34 mL/min/{1.73_m2} — ABNORMAL LOW (ref >=60)
EGFR MDRD NON AF AMER: 28 mL/min/{1.73_m2} — ABNORMAL LOW (ref >=60)
GLUCOSE RANDOM: 108 mg/dL (ref 65–179)
POTASSIUM: 4.7 mmol/L (ref 3.5–5.0)
PROTEIN TOTAL: 9.3 g/dL — ABNORMAL HIGH (ref 6.5–8.3)
SODIUM: 141 mmol/L (ref 135–145)

## 2018-01-08 LAB — CBC W/ AUTO DIFF
BASOPHILS ABSOLUTE COUNT: 0 10*9/L (ref 0.0–0.1)
BASOPHILS RELATIVE PERCENT: 0.1 %
EOSINOPHILS RELATIVE PERCENT: 2.4 %
HEMATOCRIT: 30.1 % — ABNORMAL LOW (ref 36.0–46.0)
HEMOGLOBIN: 9.6 g/dL — ABNORMAL LOW (ref 12.0–16.0)
LARGE UNSTAINED CELLS: 5 % — ABNORMAL HIGH (ref 0–4)
LYMPHOCYTES ABSOLUTE COUNT: 0.4 10*9/L — ABNORMAL LOW (ref 1.5–5.0)
LYMPHOCYTES RELATIVE PERCENT: 14.5 %
MEAN CORPUSCULAR HEMOGLOBIN CONC: 32 g/dL (ref 31.0–37.0)
MEAN CORPUSCULAR VOLUME: 95.4 fL (ref 80.0–100.0)
MEAN PLATELET VOLUME: 9.5 fL (ref 7.0–10.0)
MONOCYTES ABSOLUTE COUNT: 0.2 10*9/L (ref 0.2–0.8)
MONOCYTES RELATIVE PERCENT: 6.4 %
NEUTROPHILS RELATIVE PERCENT: 71.7 %
PLATELET COUNT: 179 10*9/L (ref 150–440)
RED BLOOD CELL COUNT: 3.16 10*12/L — ABNORMAL LOW (ref 4.00–5.20)
RED CELL DISTRIBUTION WIDTH: 22.1 % — ABNORMAL HIGH (ref 12.0–15.0)
WBC ADJUSTED: 2.5 10*9/L — ABNORMAL LOW (ref 4.5–11.0)

## 2018-01-08 LAB — PROTEIN TOTAL: Protein:MCnc:Pt:Ser/Plas:Qn:: 9.7 — ABNORMAL HIGH

## 2018-01-08 LAB — BASOPHILS RELATIVE PERCENT: Lab: 0.1

## 2018-01-08 LAB — MYELOMA SERUM CHEMISTRIES
GAMMAGLOBULIN; IGM: <25 mg/dL — ABNORMAL LOW (ref 35–290)
PROTEIN TOTAL: 9.7 g/dL — ABNORMAL HIGH (ref 6.5–8.3)

## 2018-01-08 LAB — SODIUM: Sodium:SCnc:Pt:Ser/Plas:Qn:: 141

## 2018-01-08 LAB — SMEAR REVIEW

## 2018-01-08 LAB — IMMUNOGLOBULIN FREE LT CHAINS BLOOD: KAPPA FREE,SERUM: 124.74 mg/dL — ABNORMAL HIGH (ref 0.33–1.94)

## 2018-01-08 LAB — KAPPA FREE,SERUM: Lab: 124.74 — ABNORMAL HIGH

## 2018-01-08 MED ORDER — TRAMADOL 50 MG TABLET
ORAL_TABLET | Freq: Three times a day (TID) | ORAL | 0 refills | 0.00000 days | Status: SS | PRN
Start: 2018-01-08 — End: 2018-04-21

## 2018-01-08 MED ORDER — DEXAMETHASONE 4 MG TABLET
ORAL_TABLET | Freq: Two times a day (BID) | ORAL | 0 refills | 0.00000 days | Status: CP
Start: 2018-01-08 — End: 2018-03-08

## 2018-01-08 NOTE — Unmapped (Addendum)
#  1 Multiple myeloma    1. Repeating lab work today.  2. HOLD Ninlaro, Melphalan, and Prednisone for now - await blood counts and myeloma labs.  3. If myeloma numbers worsening --> consider (1) BCMA antibody on compassionate use, (2) clinical trial if your kidneys are better, (3) focusing on comfort.  All are reasonable.   4. Dexamethasone 8mg  by mouth daily x 4 days  5. Tramadol prn pain  6. We will follow-up via phone when labs are back early next week.    Rozetta Nunnery, M.D.  Assistant Professor  Division of Hematology-Oncology    [address]  3rd floor Physicians' Office Building  [id number]  [place] [address]    Nurse Navigator: Layla Maw, RN - [phone]  Questions and appointments M-F 8am - 5pm: [phone] or [phone]

## 2018-01-08 NOTE — Unmapped (Signed)
[facility] Follow Up      REASON FOR VISIT: [name] presents today for follow-up of multiple myeloma, now on Mel/Ixa/Pred.    ASSESSMENT:  #1 IgG kappa symptomatic multiple myeloma - ISS Stage II    #2 CKD, Stage 3B, moderate (CrCl estimated 32.1 mL/min).  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   #6 Pancytopenia 2/2 melphalan + ixazomib - resolving     Current Treatment:  Ixazomib/Melphalan/Prednisone C1D1=12/03/17  Best Response: C1D1 M-spike 3.4 g/dL  Current Response: PD on benda/pom/dex    Had prolonged thrombocytopenia with the regimen.  Light chains back up last week - but no M-spike was done.  Re-ordered myeloma testing for today. Had a discussion about options if this chemotherapy regimen is not working as I suspect.      Options:  (1) symptom management/palliative care and no myeloma-directed treatment  (2) [employer] 916 compassionate use if GFR >30 ml/min (by MDRD) and counts remain improved  (3) Clinical trial: NP3 or STOMP depending on slot availability and organ function.  ECOG PS 2.       RECOMMENDATIONS:  1. Hold mel/ixa.    2. Continue valacyclovir 500mg  po daily for zoster prophy  3. dexmaethasone 8mg  po daily x 4 days for pain  4. Tramadol prn pain  5. Once myeloma labs return, will have results relayed to patient and await her decision about the options we discussed.    Kayla Nunnery, MD  Hematology/Oncology            ONCOLOGY HISTORY:  Oncology History    Diagnosis:  Symptomatic IgG kappa multiple myeloma, 01/11/14.  Kayla Torres was diagnosed with symptomatic myeloma at the age of 77 year old when she presented with symptomatic hypercalcemia.  Evaluation revealed IgG kappa symptomatic multiple myeloma based upon SPEP M-protein of 2.5 g/dL, anemia, hypercalcemia, and lytic lesions documented on skeletal survey. Bone marrow biopsy performed 01/11/14 showed normocellular bone marrow (60%) with involvement by plasma cell dyscrasia (35% plasma cells by aspirate differential, 80% plasma cells by CD138 immunohistochemical analysis of clot, and monotypic kappa by in-situ hybridization).  IgG elevated at 3208 mg/dL, IgM <16, IgA 109.     Stage @ Diagnosis: ISS 2 (B2M 4.11).   ISS I (B2M <3.5 mg/dL and albumin >6.0 g/dL) - Median OS 62 months  ISS II - neither I nor III. - Median OS 44 months  ISS III - B2M >5.5 mg/dL. - Median OS 29 months      Risk Stratification:  High risk based upon +1q  Cytogenetics:    Normal karyotype: 46,XY,inv(9)(p11q13)c[20]   Abnormal FISH: A multiple myeloma FISH panel, on a CD138+ plasma cell-enriched fraction, performed at [facility]) was abnormal. A percentage of the cells were positive for three 1q signals, monosomy 13, and trisomy 9 and 15. Gain of 1q is associated with a worse prognosis in myeloma patients.     LDH: normal at diagnosis    Treatment History/Disease Course:  1. CyBorD - x 9 cycles. VGPR, best response.  2. Lenalidomide maintenance - 10/2014 - n/v/malaise --> AKI. Subsequently changed to bortezomib q2week maintenance --> PD 07/25/15, with M-spike increased from TLTQ to 2.0 g/dL.    3. Carfilzomib 36mg /m2 + dexamethasone  TTE 07/2015 with normal EF. Addition of revlimid 15 mg 21/28 day cycle on C2. Progression 02/27/16  4. Daratumumab initiated  03/05/16 - PD  5. Cy/Pom/Dex - 04/16/16  6. Benda/Pom/Dex 03/10/17        Bone Health:  - Myeloma survey from 01/09/14: Lateral views of the calvarium show multiple small lytic lesions throughout calvarium. There is diffuse osteopenia throughout the spine. A severe compression fracture at L1 includes mild loss of the posterior vertebral body height but no listhesis. There is also endplate invagination superiorly and inferiorly at L5.  AP views of the appendicular skeleton again show diffuse osteopenia. Lytic lesions are present in the proximal and distal right femur, proximal left femur and left proximal humerus. No pathologic fracture is seen at this time.  - Bisphosphonate:  Received pamidronate in 12/2013 while hospitalized.  Starting monthly zoledronic acid 4mg  IV monthly x 2 years on 02/22/14.    Transplant Status:  Not transplanted; had initial visit with Dr. Lucretia Roers on 04/03/14. Elected to forgo cell collection or transplant.         Multiple myeloma in relapse (CMS-HCC)    01/11/2014 Initial Diagnosis     Multiple myeloma, IgG kappa.  ISS 2.  Presented with hypercalcemia and anemia.  Renal failure corrected after IV hydration.  M-spike 2.5 g/dL         10/25/1094 -  Chemotherapy     CyBorD         02/22/2014 Endocrine/Hormone Therapy     Zometa Therapy 4 mg IV monthly. Plan for two years of therapy         10/27/2014 -  Chemotherapy     Bortezomib maintenance 1.3 mg/m2 Orosi q2 weeks         07/25/2015 Progression     M-spike 2.0.  CKD stable, cr 1.45 mg/dL.  Hgb 10.6, slightly reduced from steady-state.         08/08/2015 - 02/27/2016 Chemotherapy     Carfilzomib + dexamethaonse with addition of revlimid 15 mg 21/28 day cycle on C2. 10/03/15 C3 revlimid discontinued after one cycle (C2). C5 delayed a week for URI.         02/17/2016 Progression            02/27/2016 Progression     M-spike to 2.3         03/05/2016 -  Chemotherapy     Single agent Daratumumab         04/14/2016 Progression     M-spike 3.3 up from 2.3 on single agent dara         04/15/2016 -  Chemotherapy     Initiated pom/cy/dex; Pom 4mg  d1-21, Cytoxan 400mg  PO days 1, 8, 15, dex 20mg  PO days 1, 8, 15, 22         05/05/2016 -  Chemotherapy     Dose reduced pomalidomide to 2mg  d/t AKI         03/10/2017 -  Chemotherapy     Benda/Pom/Dex         10/09/2017 Progression     M-spike 1.4.  Initiation of treatment delayed by gangrene of right 4th and 5th toes, with hospitalization            Chemotherapy     Ixazomib/Melphalan/Prednisone  M-spike 3.4            INTERVAL HISTORY:  Kayla Torres presents to clinic today with her daughter for follow up.  She reports this morning that she is doing not well, and is sore all over.  Most specifically, she is having left shoulder pain, along with chronic knee pain.  She's taking tylenol for her shoulder pain.   No diarrhea.  Her right foot is healing well, following with Vascular surgery.  No pain in her foot.  Up at home.  Appetite is good.    Otherwise, denies new bone pain, fevers, chills, night sweats, lumps/bumps, tongue swelling, shortness of breath, syncope, lightheadedness, constipation, nausea or vomiting, very easy bruising or bleeding, or urinary changes.         REVIEW OF SYSTEMS:    10-systems otherwise reviewed and negative except as per Interval History.    PAST MEDICAL HISTORY:    Past Medical History:   Diagnosis Date   ??? Arthritis    ??? High cholesterol    ??? Hypertension    ??? Multiple myeloma (CMS-HCC) 01/18/2014         PAST SURGICAL HISTORY:    Past Surgical History:   Procedure Laterality Date   ??? negative surgical history     ??? PR AMPUTATION TOE,MT-P JT Right 10/24/2017    Procedure: AMPUTATION, TOE; METATARSOPHALANGEAL JOINT;  Surgeon: Boykin Reaper, MD;  Location: MAIN OR [facility];  Service: Vascular         ALLERGIES: No Known Allergies    MEDICATIONS:    Current Outpatient Prescriptions   Medication Sig Dispense Refill   ??? acetaminophen (TYLENOL) 325 MG tablet Take 2 tablets (650 mg total) by mouth every six (6) hours as needed. 30 tablet 0   ??? alendronate (FOSAMAX) 70 MG tablet Take 1 tablet by mouth once a week. Takes on Tuesday AM     ??? amLODIPine (NORVASC) 5 MG tablet Take 5 mg by mouth daily.      ??? clopidogrel (PLAVIX) 75 mg tablet Take 1 tablet (75 mg total) by mouth daily. 30 tablet 1   ??? colchicine (COLCRYS) 0.6 mg tablet Take 1 tablet (0.6 mg total) by mouth Every Monday, Wednesday, and Friday. 12 tablet 3   ??? losartan (COZAAR) 50 MG tablet      ??? magnesium oxide (MAG-OX) 400 mg tablet Take 1 tablet (400 mg total) by mouth Two (2) times a day. 120 tablet 2   ??? metoprolol tartrate (LOPRESSOR) 25 MG tablet Take 0.5 tablets (12.5 mg total) by mouth Two (2) times a day. 30 tablet 0   ??? mirtazapine (REMERON) 45 MG tablet Take 1 tablet (45 mg total) by mouth nightly. 30 tablet 6   ??? ondansetron (ZOFRAN-ODT) 4 MG disintegrating tablet Take 1 tablet (4 mg total) by mouth every eight (8) hours as needed for nausea. 60 tablet 3   ??? oxyCODONE (ROXICODONE) 5 MG immediate release tablet      ??? polyethylene glycol (MIRALAX) 17 gram packet Take 17 g by mouth daily as needed. 10 packet 0   ??? potassium chloride (KLOR-CON) 10 MEQ CR tablet Take 2 tablets (20 mEq total) by mouth daily. 30 tablet 11   ??? senna (SENOKOT) 8.6 mg tablet Take 1 tablet by mouth nightly as needed for constipation. 10 tablet 0   ??? sodium hypochlorite (DAKIN'S, HALF-STRENGTH,) external solution Apply topically daily. Use as a wound cleanser, pat dry, then apply dressing 473 mL 1   ??? telmisartan (MICARDIS) 40 MG tablet Take 40 mg by mouth daily.     ??? traMADol (ULTRAM) 50 mg tablet Take 0.5 tablets (25 mg total) by mouth every eight (8) hours as needed for pain. 60 tablet 0   ??? valACYclovir (VALTREX) 500 MG tablet Take 1 tablet (500 mg total) by mouth daily. 30 tablet  6   ??? zinc gluconate 100 mg Tab Take 100 mg by mouth daily at 0600. 30 each 11   ??? dexamethasone (DECADRON) 4 MG tablet Take 2 tablets (8 mg total) by mouth 2 (two) times a day with meals. 120 tablet 0   ??? ixazomib (NINLARO) 2.3 mg capsule Take 1 capsule (2.3 mg total) by mouth every seven (7) days. Take at least one hour before or two hours after food. Take days 1, 8, 15. (Patient not taking: Reported on 01/08/2018) 3 capsule 1   ??? melphalan (ALKERAN) 2 mg tablet Take 5 tablets (10 mg total) by mouth Take as directed. Take 5 tabs days 1-4 of a 28-day cycle. (Patient not taking: Reported on 01/08/2018) 20 tablet 0   ??? predniSONE (DELTASONE) 50 MG tablet Take 2 tablets (100 mg total) by mouth daily. Days 1-4 of a 28-day cycle (Patient not taking: Reported on 01/08/2018) 8 tablet 1   ??? silver sulfaDIAZINE (SILVADENE) 1 % cream Apply topically daily. 100 g 2     No current facility-administered medications for this visit.          PHYSICAL EXAMINATION:  VITALS:   Vitals:    01/08/18 0757   BP: 120/70   Pulse: 76   Resp: 18   Temp: 36.7 ??C (98 ??F)   TempSrc: Oral   SpO2: 94%   Height: 170.2 cm (5' 7.01)       ECOG PERFORMANCE STATUS: 1    GENERAL:No acute distress. She is ambulatory without assistive devices today.  She is accompanied by her daughter, Kayla Torres.  HEENT: Anicteric sclera. No conjunctival pallor. No oropharyngeal exudates, erythema, nor ulceration.  Dentures are poorly fitted and affect her speech.  LYMPH: No cervical, supraclavicular, nor axillary adenopathy.  CV: S1,S2, no murmurs, rubs, nor gallops.    LUNGS: Speaking comfortably in full sentences without increased work of breathing. Clear to auscultation bilaterally.  ABDOMEN: Soft, non-tender, and non-distended. Normoactive bowel sounds. No hepatosplenomegaly.  EXTREMITIES: No edema.    LABS/IMAGING/OTHER TESTS:  Results for orders placed or performed in visit on 01/08/18   Comprehensive Metabolic Panel   Result Value Ref Range    Sodium 141 135 - 145 mmol/L    Potassium 4.7 3.5 - 5.0 mmol/L    Chloride 109 (H) 98 - 107 mmol/L    CO2 23.0 22.0 - 30.0 mmol/L    BUN 11 7 - 21 mg/dL    Creatinine 1.61 (H) 0.60 - 1.00 mg/dL    BUN/Creatinine Ratio 6     EGFR MDRD Non Af Amer 28 (L) >=60 mL/min/1.3m2    EGFR MDRD Af Amer 34 (L) >=60 mL/min/1.24m2    Anion Gap 9 9 - 15 mmol/L    Glucose 108 65 - 179 mg/dL    Calcium 9.1 8.5 - 09.6 mg/dL    Albumin 3.7 3.5 - 5.0 g/dL    Total Protein 9.3 (H) 6.5 - 8.3 g/dL    Total Bilirubin 0.4 0.0 - 1.2 mg/dL    AST 24 14 - 38 U/L    ALT 16 15 - 48 U/L    Alkaline Phosphatase 144 (H) 38 - 126 U/L   Immunoglobulin Free LT Chains Blood   Result Value Ref Range    Kappa Free, Serum 124.74 (H) 0.33 - 1.94 mg/dL    Lambda Free, Serum <0.45 (L) 0.57 - 2.63 mg/dL    K/L FLC Ratio  4.09 - 1.65   CBC w/ Differential   Result Value Ref  Range    WBC 2.5 (L) 4.5 - 11.0 10*9/L    RBC 3.16 (L) 4.00 - 5.20 10*12/L    HGB 9.6 (L) 12.0 - 16.0 g/dL    HCT 16.1 (L) 09.6 - 46.0 %    MCV 95.4 80.0 - 100.0 fL    MCH 30.5 26.0 - 34.0 pg    MCHC 32.0 31.0 - 37.0 g/dL    RDW 04.5 (H) 40.9 - 15.0 %    MPV 9.5 7.0 - 10.0 fL    Platelet 179 150 - 440 10*9/L    Neutrophils % 71.7 %    Lymphocytes % 14.5 %    Monocytes % 6.4 %    Eosinophils % 2.4 %    Basophils % 0.1 %    Absolute Neutrophils 1.8 (L) 2.0 - 7.5 10*9/L    Absolute Lymphocytes 0.4 (L) 1.5 - 5.0 10*9/L    Absolute Monocytes 0.2 0.2 - 0.8 10*9/L    Absolute Eosinophils 0.1 0.0 - 0.4 10*9/L    Absolute Basophils 0.0 0.0 - 0.1 10*9/L    Large Unstained Cells 5 (H) 0 - 4 %    Microcytosis Slight (A) Not Present    Macrocytosis Marked (A) Not Present    Anisocytosis Marked (A) Not Present    Hypochromasia Slight (A) Not Present   MYELOMA WORKUP, SERUM   Result Value Ref Range    ALBUMIN (SPE) 3.4 (L) 3.5 - 5.0 g/dL    ALPHA-1 GLOBULIN 0.5 0.2 - 0.5 g/dL    ALPHA-2 GLOBULIN 0.8 0.5 - 1.1 g/dL    BETA-1 GLOBULIN 0.2 (L) 0.3 - 0.6 g/dL    BETA-2 GLOBULIN 0.2 0.2 - 0.6 g/dL    GAMMAGLOBULIN 4.6 (H) 0.5 - 1.5 g/dL    M Spike 4.2 (H) Not Present g/dL    SPE Interpretation      Immunofixation Electrophoresis, Serum      Total Protein 9.7 (H) 6.5 - 8.3 g/dL   Myeloma Workup Chemistries   Result Value Ref Range    Total Protein 9.7 (H) 6.5 - 8.3 g/dL    Total IgG 8,119 (H) 600-1,700 mg/dL    IgM <14 (L) 35 - 782 mg/dL    IgA 95.6 (L) 21.3 - 400.0 mg/dL   Morphology Review   Result Value Ref Range    Smear Review Comments See Comment (A) Undefined

## 2018-01-09 NOTE — Unmapped (Signed)
Called and spoke with patient's daughter.  Advised that renal function up a little bit on last check and that Vitamin D was low.  They will start OTC vitamin D.  Daughter advised that they are considering clinical trial vs other chemotherapy for patient's MM.    Ladon Applebaum, MD  Rheumatology Fellow, PGY 5  Pager [phone]

## 2018-01-11 ENCOUNTER — Ambulatory Visit: Admit: 2018-01-11 | Discharge: 2018-01-12 | Payer: MEDICARE

## 2018-01-11 DIAGNOSIS — L97512 Non-pressure chronic ulcer of other part of right foot with fat layer exposed: Secondary | ICD-10-CM

## 2018-01-11 DIAGNOSIS — L97511 Non-pressure chronic ulcer of other part of right foot limited to breakdown of skin: Secondary | ICD-10-CM

## 2018-01-11 DIAGNOSIS — IMO0002 Toe amputation status, right: Secondary | ICD-10-CM

## 2018-01-11 DIAGNOSIS — I739 Peripheral vascular disease, unspecified: Principal | ICD-10-CM

## 2018-01-11 DIAGNOSIS — Z89421 Acquired absence of other right toe(s): Secondary | ICD-10-CM

## 2018-01-11 LAB — MYELOMA WORKUP, SERUM
ALPHA-2 GLOBULIN: 0.8 g/dL (ref 0.5–1.1)
BETA-1 GLOBULIN: 0.2 g/dL — ABNORMAL LOW (ref 0.3–0.6)
BETA-2 GLOBULIN: 0.2 g/dL (ref 0.2–0.6)
GAMMAGLOBULIN: 4.6 g/dL — ABNORMAL HIGH (ref 0.5–1.5)
M SPIKE: 4.2 g/dL — ABNORMAL HIGH
PROTEIN TOTAL: 9.7 g/dL — ABNORMAL HIGH (ref 6.5–8.3)

## 2018-01-11 LAB — ALPHA-2 GLOBULIN: Lab: 0.8

## 2018-01-11 MED ORDER — SILVER SULFADIAZINE 1 % TOPICAL CREAM
Freq: Every day | TOPICAL | 2 refills | 0.00000 days | Status: CP
Start: 2018-01-11 — End: 2018-04-16

## 2018-01-11 NOTE — Unmapped (Addendum)
Wound care:    1)  Use one tsp hibiclens mixed with 2 cups distilled water and spray over wound then gently wipe clean   2)  1 cup white distilled vinegar to 3 cups distilled water - mix and soak on gauze and leave over the wound for 5 minutes  3)  Then Spray Anasept and/or Dakins to the wound bed and surrounding area and let dry, or apply to a gauze pad then apply to the wound for 5 minutes, before applying the dressing.    Dressing to wound as follows:    Apply silvadene, gauze, and secure with tape. Change daily.      If your wound starts to develop the following , please call the [facility] for further advise:    ??  Increased drainage  ??  Redness around the wound  ??  Strong odor from the wound when changing the bandages  ??  Increased pain    Please do not hesitate to leave a voicemail on the nurse line. We make every effort to return your call the same day or the next day. Please leave a clear message with your name, date of birth,  and your medical record number. Leave a brief description of your problem.    If you are experiencing the following, please call us for advise or consider going to the nearest local Emergency Department or call 911.    ??  Fever of 100 F  ??  Nausea or Vomitting  ??  Pus draining from your wound  ??  Redness of the whole foot or leg  ??  Severe increase in pain above your baseline.    [facility]  [phone]           Learning About Neuropathy From Chemotherapy  What is neuropathy?    Chemotherapy, or chemo, can cause damage to the nerves. This damage is called peripheral neuropathy (say puh-RIFF-uh-rul noo-RAW-puh-thee). It can affect the nerves that control your sense of touch, how you feel pain and temperature, and your muscle strength.  What are the symptoms?  Some common symptoms of neuropathy are:  ?? Numbness, tightness, and tingling. This usually starts in the fingers and toes.  ?? Loss of feeling.  ?? Burning, shooting, or stabbing pain in the legs, hands, and feet. Often the pain is worse at night.  ?? Weakness and loss of balance.  ?? A change in how sensitive you are to touch or temperature. You may sense them more or less than normal.  Neuropathy from chemo usually builds slowly over a few months. You may have your worst symptoms right after a chemo treatment. Then they may improve a bit until the next treatment.  After you finish chemo, your symptoms may improve or go away. This may take as much as a year or more. In some cases, some of the nerve damage may be permanent.  How is neuropathy treated?  For treating pain, there is no single treatment that works for everyone. You and your doctor may want to try different things.  ?? Your doctor may recommend medicines. These include pain relievers, certain kinds of antidepressants such as amitriptyline, and certain kinds of antiseizure medicine such as gabapentin.  ?? Be safe with medicines. Take your medicines exactly as prescribed. Call your doctor if you think you are having a problem with your medicine.  ?? You may try therapies such as acupuncture, physical therapy, and transcutaneous electrical nerve stimulation (  TENS). And you can try relaxation techniques such as deep breathing.  Tell your doctor right away about any new or changing symptoms during chemo. Your doctor may be able to change your chemo treatment to help prevent nerve damage.  What can you do at home?  Avoiding injury  Be careful to avoid injury.  ?? When your feet or legs feel numb, it's easier to lose your balance and fall. Try to avoid walking on uneven surfaces. At home:  ? Keep floors and stairs clear of throw rugs and clutter.  ? Install sturdy handrails on stairways.  ? Put grab bars near your shower, bathtub, and toilet.  ? If you need one, use a cane, walker, or wheelchair.  ?? To avoid burning yourself:  ? Use pot holders.  ? Avoid hot water and steam when you cook.  ? Always check water from a hot faucet or shower using a part of your body that can feel temperature normally, such as your elbow.  ?? Check your feet, legs, hands, and arms every day for skin and nail problems that can get worse without your feeling them. If you need to, have someone else check for you.  ? Look at all parts of your feet. Be sure to check your toes.  ? If needed, use a handheld mirror to inspect your feet. Or you can attach a magnifying mirror to the bathroom wall near the baseboard.  ?? Avoid walking with bare feet if they are affected.  ?? Try wearing gloves to protect your hands when you work outside.  Healthy lifestyle  Eat a balanced diet. Also, avoid alcohol and smoking. They can make neuropathy worse.  Follow-up care is a key part of your treatment and safety. Be sure to make and go to all appointments, and call your doctor if you are having problems. It's also a good idea to know your test results and keep a list of the medicines you take.  Where can you learn more?  Go to [facility] at [email or web address]  Select Preferences in the upper right hand corner, then select Health Library under Resources. Enter [phone] in the search box to learn more about Learning About Neuropathy From Chemotherapy.  Current as of: January 13, 2017  Content Version: 11.9  ?? 2006-2018 [employer]. Care instructions adapted under license by [facility]. If you have questions about a medical condition or this instruction, always ask your healthcare professional. [employer] disclaims any warranty or liability for your use of this information.

## 2018-01-11 NOTE — Unmapped (Signed)
Debridement  Date/Time: 01/11/2018 4:29 PM  Performed by: Barbette Or  Authorized by: Barbette Or   Preparation: Patient was prepped and draped in the usual sterile fashion.  Local anesthesia used: yes    Anesthesia:  Local anesthesia used: yes  Local Anesthetic: topical anesthetic    Sedation:  Patient sedated: no  Patient tolerance: Patient tolerated the procedure well with no immediate complications  Comments: Based on the necrotic tissue and biofilm present, the decision was made to debride. All Benefits, risks, and alternatives were explained in detail to the patient. A time out was taken prior to the procedure. The area was prepped and draped in standard clinic fashion. Selective debridement was performed to fully debride all devitalized and necrotic tissue down to bleeding granulation tissue with the use of a curette, forceps and scalpel. Topical 4% lidocaine was applied 10 minutes prior to the debridement and the patient tolerated this well. Hemostasis was achieved with pressure and the measurements did not change post-operatively.               Patient Active Problem List   Diagnosis   ??? Hypertension   ??? Multiple myeloma in relapse (CMS-HCC)   ??? Confusion   ??? Constipation   ??? CKD (chronic kidney disease) stage 3, GFR 30-59 ml/min (CMS-HCC)   ??? Peripheral neuropathy due to chemotherapy (CMS-HCC)   ??? Gangrene of foot (CMS-HCC)   ??? Chemotherapy-induced neutropenia (CMS-HCC)   ??? Thrombocytopenia (CMS-HCC)     Kayla Torres is a pleasant 77 year old female with the above medical problems who has had a previous fourth and fifth toe amputation.  She has an ulcer at the amputation site and has been using Silvadene topically to the ulcer.  She denies any increased drainage and no odor from the ulcer.  Overall she feels that it is slightly improving.  No other new concerns on today's visit.    Physical exam    General: 77 year old female in no apparent distress.    Blood pressure 162/87, pulse 90, temperature 36.3 ??C (97.4 ??F), temperature source Temporal, not currently breastfeeding.    Wound 12/29/17 Foot Right 4-5th toe amp site (Active)   Wound Status Not Healed 01/11/2018  8:30 AM   Pain 0 01/11/2018  8:30 AM   Dressing Status      Removed 01/11/2018  8:30 AM   Wound Length (cm)      0.4 01/11/2018  8:30 AM   Wound Width (cm)      0.1 01/11/2018  8:30 AM   Wound Depth (cm)      0.4 01/11/2018  8:30 AM   Area (sq cm) (Calculated) 0.04 01/11/2018  8:30 AM   Volume (mL) (Calculated) 0.02 01/11/2018  8:30 AM   Wound Length % Healed (Calculated) 80.95 % 01/11/2018  8:30 AM   Wound Width % Healed (Calculated) 91.67 % 01/11/2018  8:30 AM   Wound Depth % Healed (Calculated) -300 % 01/11/2018  8:30 AM   Area (sq cm) % Healed (Calculated) 98.41 % 01/11/2018  8:30 AM   Volume (mL) % Healed (Calculated) 98.41 % 01/11/2018  8:30 AM   Odor None 01/11/2018  8:30 AM   Margins Undefined edges 01/11/2018  8:30 AM   Peri-wound Assessment      Hemosiderin staining 01/11/2018  8:30 AM   Encounter Subsequent 01/11/2018  8:30 AM   Progress Initial Exam 12/29/2017 10:43 AM   Slough % 76-100% 01/11/2018  8:30 AM   Eschar % None 01/11/2018  8:30 AM   Epithelialization % None 01/11/2018  8:30 AM   Granulation % None 01/11/2018  8:30 AM   Exudate Type      Sero-sanguineous 01/11/2018  8:30 AM   Exudate Amnt      Scant 01/11/2018  8:30 AM   Thickness Eschar Covered 12/29/2017 10:43 AM   Exposed Structure N/A 01/11/2018  8:30 AM   Paring No 01/11/2018  8:30 AM   Texture Callus 01/11/2018  8:30 AM   Moisture Normal For Patient 01/11/2018  8:30 AM   Temperature WNL 01/11/2018  8:30 AM   Hypergranuation No 01/11/2018  8:30 AM   Tunneling      No 01/11/2018  8:30 AM   Undermining     No 01/11/2018  8:30 AM   Sinus Tract No 01/11/2018  8:30 AM   Treatments Cleansed/Irrigation 01/11/2018  8:30 AM   Picture Taken Yes 01/11/2018  8:30 AM   Dressing Other (Comment) 01/11/2018  9:24 AM   Length (Pre Debridement) 0.4 cm 01/11/2018  9:24 AM   Width (Pre Debridement) 0.1 cm 01/11/2018  9:24 AM Depth (Pre Debridement) 0.4 cm 01/11/2018  9:24 AM   Pre-Procedure Pain 0 01/11/2018  9:24 AM   Time Out Taken Yes 01/11/2018  9:24 AM   Instrument Used scissors;forceps 01/11/2018  9:24 AM   Tissue/Material Removed non-viable;callus 01/11/2018  9:24 AM   Bleeding none 01/11/2018  9:24 AM   Bleeding controlled with n/a 01/11/2018  9:24 AM   Specimen Taken none 01/11/2018  9:24 AM   Type of Debridement selective 01/11/2018  9:24 AM   Level of Debridement skin, dermis 01/11/2018  9:24 AM   Procedural Pain 0 01/11/2018  9:24 AM   Length (Post-Debridement) 1.5 cm 01/11/2018  9:24 AM   Width (Post Debridement) 0.8 cm 01/11/2018  9:24 AM   Depth (Post-Debridement) 0.3 cm 01/11/2018  9:24 AM   Area(Pre-Debridement) 0.04 sq cm 01/11/2018  9:24 AM   Area(Post-Debridement) 1.2 sq cm 01/11/2018  9:24 AM   Volume(Pre-Debridement) 0.02 sq cm 01/11/2018  9:24 AM   Volume(Post-Debridement) 0.36 sq cm 01/11/2018  9:24 AM     Extremities: There is heavy thick callusing and necrotic tissue present within the ulcer wound bed.  This was all debrided.  I was able to debride down to some granulation tissue at the periphery of the ulcer.  Fatty tissue present at the base of the ulcer.  Overall the wound seems to be decrease in length.  I see no evidence of infection and there is no exposed or palpable bone.  Pedal pulses palpable.    Impression: Peripheral neuropathy, nondiabetic, status post toe amputation with nonhealing ulcer but some improvement    Treatment plan    1.  There is still a very minimal amount of necrosis centrally within the ulcer.  But there is healthy granulation tissue present at the periphery.  I would like to continue with the Silvadene for short time longer until we can get this well granulated.    2.  I have emphasized the need for continued offloading of the foot and use of an offloading shoe.    3.  Overall I think that she is doing very well.  Her last culture did not show any growth of organisms and I do not see the need for antibiotics or for further x-rays at this time.  We will see her back in the next 2-3 weeks for routine evaluation and dressing change and all return precautions have been  reviewed in detail.

## 2018-01-12 NOTE — Unmapped (Signed)
Jenn from [employer] contacted the Communication Center in regards to the patient prescription of Pomalyst.  She wants to know if the medication has been discontinued and if so why.    Britt Boozer can be reached at [phone]. [phone].    Thanks in advance,  Vernie Ammons  [facility]  [phone]

## 2018-01-14 NOTE — Unmapped (Deleted)
AOC Triage Note     Patient: Kayla Torres     Reason for call:      Time call returned:      Phone Assessment:      Triage Recommendations:      Patient Response:    Outstanding tasks: Patient Pharmacy has been verified and primary pharmacy has been marked as preferred

## 2018-01-14 NOTE — Unmapped (Signed)
AOC Triage Note     Patient: Kayla Torres     Reason for call: returning call to [facility]    Time call returned: 14:23     Phone Assessment: Lt message on voicemail to return call, no patient identification relayed on voicemail.      Triage Recommendations: N/A     Patient Response:N/A     Outstanding tasks: Awaiting a return call

## 2018-01-14 NOTE — Unmapped (Signed)
Candise Bowens with [employer] [facility] contacted the Communication Center requesting to speak with the patient's care team regarding a Pomalyst medication. She wanted to know if the medication was discontinued and if so, what was the reason.    Candise Bowens may be reached at [phone].    Thanks in advance,  Jodi Mourning  [facility]  [phone]

## 2018-01-14 NOTE — Unmapped (Signed)
Misty Stanley contacted the Communication Center requesting to speak with Lurena Joiner regarding the patient.    Misty Stanley may be reached at [phone].    Thanks in advance,  Jodi Mourning  [facility]  [phone]

## 2018-01-27 ENCOUNTER — Encounter: Admit: 2018-01-27 | Discharge: 2018-01-28 | Payer: MEDICARE | Attending: Adult Health | Primary: Adult Health

## 2018-01-27 ENCOUNTER — Other Ambulatory Visit: Admit: 2018-01-27 | Discharge: 2018-01-28 | Payer: MEDICARE

## 2018-01-27 DIAGNOSIS — G620 Drug-induced polyneuropathy: Secondary | ICD-10-CM

## 2018-01-27 DIAGNOSIS — N183 Chronic kidney disease, stage 3 (moderate): Secondary | ICD-10-CM

## 2018-01-27 DIAGNOSIS — C9002 Multiple myeloma in relapse: Principal | ICD-10-CM

## 2018-01-27 DIAGNOSIS — D696 Thrombocytopenia, unspecified: Secondary | ICD-10-CM

## 2018-01-27 DIAGNOSIS — T451X5A Adverse effect of antineoplastic and immunosuppressive drugs, initial encounter: Secondary | ICD-10-CM

## 2018-01-27 LAB — COMPREHENSIVE METABOLIC PANEL
ALBUMIN: 3.6 g/dL (ref 3.5–5.0)
ALKALINE PHOSPHATASE: 69 U/L (ref 38–126)
ALT (SGPT): 39 U/L (ref 15–48)
AST (SGOT): 17 U/L (ref 14–38)
BILIRUBIN TOTAL: 0.6 mg/dL (ref 0.0–1.2)
BLOOD UREA NITROGEN: 23 mg/dL — ABNORMAL HIGH (ref 7–21)
BUN / CREAT RATIO: 23
CALCIUM: 8.6 mg/dL (ref 8.5–10.2)
CHLORIDE: 106 mmol/L (ref 98–107)
CO2: 23 mmol/L (ref 22.0–30.0)
CREATININE: 1 mg/dL (ref 0.60–1.00)
EGFR MDRD AF AMER: >=60 mL/min/{1.73_m2} (ref >=60)
GLUCOSE RANDOM: 118 mg/dL (ref 65–179)
POTASSIUM: 4.6 mmol/L (ref 3.5–5.0)
PROTEIN TOTAL: 8.5 g/dL — ABNORMAL HIGH (ref 6.5–8.3)
SODIUM: 138 mmol/L (ref 135–145)

## 2018-01-27 LAB — CBC W/ AUTO DIFF
BASOPHILS ABSOLUTE COUNT: 0 10*9/L (ref 0.0–0.1)
BASOPHILS RELATIVE PERCENT: 0.3 %
EOSINOPHILS ABSOLUTE COUNT: 0 10*9/L (ref 0.0–0.4)
EOSINOPHILS RELATIVE PERCENT: 0.5 %
HEMOGLOBIN: 10.3 g/dL — ABNORMAL LOW (ref 12.0–16.0)
LARGE UNSTAINED CELLS: 1 % (ref 0–4)
LYMPHOCYTES ABSOLUTE COUNT: 0.1 10*9/L — ABNORMAL LOW (ref 1.5–5.0)
LYMPHOCYTES RELATIVE PERCENT: 2 %
MEAN CORPUSCULAR HEMOGLOBIN CONC: 33.2 g/dL (ref 31.0–37.0)
MEAN CORPUSCULAR HEMOGLOBIN: 33.2 pg (ref 26.0–34.0)
MEAN CORPUSCULAR VOLUME: 99.9 fL (ref 80.0–100.0)
MEAN PLATELET VOLUME: 9.2 fL (ref 7.0–10.0)
MONOCYTES ABSOLUTE COUNT: 0.1 10*9/L — ABNORMAL LOW (ref 0.2–0.8)
MONOCYTES RELATIVE PERCENT: 2.6 %
NEUTROPHILS ABSOLUTE COUNT: 5 10*9/L (ref 2.0–7.5)
PLATELET COUNT: 71 10*9/L — ABNORMAL LOW (ref 150–440)
RED BLOOD CELL COUNT: 3.1 10*12/L — ABNORMAL LOW (ref 4.00–5.20)
WBC ADJUSTED: 5.4 10*9/L (ref 4.5–11.0)

## 2018-01-27 LAB — SODIUM: Sodium:SCnc:Pt:Ser/Plas:Qn:: 138

## 2018-01-27 LAB — GAMMAGLOBULIN; IGM: IgM:MCnc:Pt:Ser/Plas:Qn:: <25 — ABNORMAL LOW

## 2018-01-27 LAB — KAPPA FREE,SERUM: Lab: 27.97 — ABNORMAL HIGH

## 2018-01-27 LAB — SLIDE REVIEW

## 2018-01-27 LAB — SMEAR REVIEW

## 2018-01-27 LAB — IMMUNOGLOBULIN FREE LT CHAINS BLOOD: LAMBDA FREE, SER: <0.25 mg/dL — ABNORMAL LOW (ref 0.57–2.63)

## 2018-01-27 LAB — MYELOMA SERUM CHEMISTRIES
GAMMAGLOBULIN; IGG: 4285 mg/dL — ABNORMAL HIGH (ref 600–1700)
GAMMAGLOBULIN; IGM: <25 mg/dL — ABNORMAL LOW (ref 35–290)

## 2018-01-27 LAB — LYMPHOCYTES ABSOLUTE COUNT: Lab: 0.1 — ABNORMAL LOW

## 2018-01-27 NOTE — Unmapped (Signed)
[facility] Follow Up      REASON FOR VISIT: Kayla Torres presents today for follow-up of multiple myeloma, having progressed on all available lines of therapy.    ASSESSMENT:  #1 IgG kappa symptomatic multiple myeloma - ISS Stage II    #2 CKD, Stage 3B, moderate (CrCl estimated 32.1 mL/min).  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   #6 Pancytopenia 2/2 melphalan + ixazomib - resolving     Current Treatment: awaiting BCMA compassionate use  Best Response: n/a  Current Response: PD on mel/ixa/pred    Kayla Torres is doing very well off of chemotherapy.  She feels great and has no concerns today.    We discussed that we are still waiting approval of the compassionate use BCMA.  Until we are able to obtain that, we will continue with best supportive care, including blood transfusions PRN.  Kayla Torres and her daughter are pleased with this and are happy that we have a plan moving forward.    Most confusingly, her light chains are down to 27.97 from 124.74.  She has not been on chemo since this has come down.  Her total protein is lower, as is her IgA.  Her M-spike is pending.  Hgb remains stable, but platelets have taken a slight dip.  MCV has increased just a bit to 99.9.    She is having some difficulty with sleep, mostly getting back to sleep after waking up to use the bathroom in the middle of the night.  She will retry melatonin as this has worked for her in the past.    Of note, her creatinine has also much improved, decreasing from a baseline in the 1.3-1.4 to 1 today.  She has started on colchicine lately but this shouldn't have had an effect on her creatinine.  We'll keep watching this.    RECOMMENDATIONS:  1. Wait for approval for BMCA  2. Continue valacyclovir 500mg  po daily for zoster prophy  3. Tramadol prn pain  4.  Melatonin for sleep 1-3mg   4. Follow up in 4 weeks    [name], AGPCNP-C, MSN, OCN  Nurse Practitioner  Hematologic Malignancies  [facility]  [phone] (phone)  [phone] (fax)  [email or web address]            ONCOLOGY HISTORY:  Oncology History    Diagnosis:  Symptomatic IgG kappa multiple myeloma, 01/11/14.  Kayla Torres was diagnosed with symptomatic myeloma at the age of 77 year old when she presented with symptomatic hypercalcemia.  Evaluation revealed IgG kappa symptomatic multiple myeloma based upon SPEP M-protein of 2.5 g/dL, anemia, hypercalcemia, and lytic lesions documented on skeletal survey. Bone marrow biopsy performed 01/11/14 showed normocellular bone marrow (60%) with involvement by plasma cell dyscrasia (35% plasma cells by aspirate differential, 80% plasma cells by CD138 immunohistochemical analysis of clot, and monotypic kappa by in-situ hybridization).  IgG elevated at 3208 mg/dL, IgM <25, IgA 366.     Stage @ Diagnosis: ISS 2 (B2M 4.11).   ISS I (B2M <3.5 mg/dL and albumin >4.4 g/dL) - Median OS 62 months  ISS II - neither I nor III. - Median OS 44 months  ISS III - B2M >5.5 mg/dL. - Median OS 29 months      Risk Stratification:  High risk based upon +1q  Cytogenetics:    Normal karyotype: 46,XY,inv(9)(p11q13)c[20]   Abnormal FISH: A  multiple myeloma FISH panel, on a CD138+ plasma cell-enriched fraction, performed at [facility]) was abnormal. A percentage of the cells were positive for three 1q signals, monosomy 13, and trisomy 9 and 15. Gain of 1q is associated with a worse prognosis in myeloma patients.     LDH: normal at diagnosis    Treatment History/Disease Course:  1. CyBorD - x 9 cycles. VGPR, best response.  2. Lenalidomide maintenance - 10/2014 - n/v/malaise --> AKI. Subsequently changed to bortezomib q2week maintenance --> PD 07/25/15, with M-spike increased from TLTQ to 2.0 g/dL.    3. Carfilzomib 36mg /m2 + dexamethasone  TTE 07/2015 with normal EF. Addition of revlimid 15 mg 21/28 day cycle on C2. Progression 02/27/16  4. Daratumumab initiated 03/05/16 - PD  5. Cy/Pom/Dex - 04/16/16  6. Benda/Pom/Dex 03/10/17        Bone Health:  - Myeloma survey from 01/09/14: Lateral views of the calvarium show multiple small lytic lesions throughout calvarium. There is diffuse osteopenia throughout the spine. A severe compression fracture at L1 includes mild loss of the posterior vertebral body height but no listhesis. There is also endplate invagination superiorly and inferiorly at L5.  AP views of the appendicular skeleton again show diffuse osteopenia. Lytic lesions are present in the proximal and distal right femur, proximal left femur and left proximal humerus. No pathologic fracture is seen at this time.  - Bisphosphonate:  Received pamidronate in 12/2013 while hospitalized.  Starting monthly zoledronic acid 4mg  IV monthly x 2 years on 02/22/14.    Transplant Status:  Not transplanted; had initial visit with Dr. Lucretia Roers on 04/03/14. Elected to forgo cell collection or transplant.         Multiple myeloma in relapse (CMS-HCC)    01/11/2014 Initial Diagnosis     Multiple myeloma, IgG kappa.  ISS 2.  Presented with hypercalcemia and anemia.  Renal failure corrected after IV hydration.  M-spike 2.5 g/dL         10/25/1094 -  Chemotherapy     CyBorD         02/22/2014 Endocrine/Hormone Therapy     Zometa Therapy 4 mg IV monthly. Plan for two years of therapy         10/27/2014 -  Chemotherapy     Bortezomib maintenance 1.3 mg/m2 Alsen q2 weeks         07/25/2015 Progression     M-spike 2.0.  CKD stable, cr 1.45 mg/dL.  Hgb 10.6, slightly reduced from steady-state.         08/08/2015 - 02/27/2016 Chemotherapy     Carfilzomib + dexamethaonse with addition of revlimid 15 mg 21/28 day cycle on C2. 10/03/15 C3 revlimid discontinued after one cycle (C2). C5 delayed a week for URI.         02/17/2016 Progression            02/27/2016 Progression     M-spike to 2.3         03/05/2016 -  Chemotherapy     Single agent Daratumumab         04/14/2016 Progression     M-spike 3.3 up from 2.3 on single agent dara         04/15/2016 -  Chemotherapy     Initiated pom/cy/dex; Pom 4mg  d1-21, Cytoxan 400mg  PO days 1, 8, 15, dex 20mg  PO days 1, 8, 15, 22         05/05/2016 -  Chemotherapy     Dose reduced  pomalidomide to 2mg  d/t AKI         03/10/2017 -  Chemotherapy     Benda/Pom/Dex         10/09/2017 Progression     M-spike 1.4.  Initiation of treatment delayed by gangrene of right 4th and 5th toes, with hospitalization            Chemotherapy     Ixazomib/Melphalan/Prednisone  M-spike 3.4            INTERVAL HISTORY:  Kayla Torres presents to clinic today with her daughter for follow up.  Feeling well and feels like she is stronger and goes out and does more things.  Reports that she's eating well but not sleeping well.  Says she gets up to go to the bathroom in the middle of the night and has trouble getting back to sleep.  Was started on colchicine and vitamin D recently.  Her foot is doing better.    Otherwise, denies new bone pain, fevers, chills, night sweats, lumps/bumps, tongue swelling, shortness of breath, syncope, lightheadedness, constipation, nausea or vomiting, very easy bruising or bleeding, or urinary changes.         REVIEW OF SYSTEMS:    10-systems otherwise reviewed and negative except as per Interval History.    PAST MEDICAL HISTORY:    Past Medical History:   Diagnosis Date   ??? Arthritis    ??? High cholesterol    ??? Hypertension    ??? Multiple myeloma (CMS-HCC) 01/18/2014         PAST SURGICAL HISTORY:    Past Surgical History:   Procedure Laterality Date   ??? negative surgical history     ??? PR AMPUTATION TOE,MT-P JT Right 10/24/2017    Procedure: AMPUTATION, TOE; METATARSOPHALANGEAL JOINT;  Surgeon: Boykin Reaper, MD;  Location: MA[facility] OR [facility];  Service: Vascular         ALLERGIES: No Known Allergies    MEDICATIONS:    Current Outpatient Medications   Medication Sig Dispense Refill   ??? acetaminophen (TYLENOL) 325 MG tablet Take 2 tablets (650 mg total) by mouth every six (6) hours as needed. 30 tablet 0   ??? alendronate (FOSAMAX) 70 MG tablet Take 1 tablet by mouth once a week. Takes on Tuesday AM     ??? amLODIPine (NORVASC) 5 MG tablet Take 5 mg by mouth daily.      ??? clopidogrel (PLAVIX) 75 mg tablet Take 1 tablet (75 mg total) by mouth daily. 30 tablet 1   ??? colchicine (COLCRYS) 0.6 mg tablet Take 1 tablet (0.6 mg total) by mouth Every Monday, Wednesday, and Friday. 12 tablet 3   ??? dexamethasone (DECADRON) 4 MG tablet Take 2 tablets (8 mg total) by mouth 2 (two) times a day with meals. 120 tablet 0   ??? ixazomib (NINLARO) 2.3 mg capsule Take 1 capsule (2.3 mg total) by mouth every seven (7) days. Take at least one hour before or two hours after food. Take days 1, 8, 15. (Patient not taking: Reported on 01/08/2018) 3 capsule 1   ??? losartan (COZAAR) 50 MG tablet      ??? magnesium oxide (MAG-OX) 400 mg tablet Take 1 tablet (400 mg total) by mouth Two (2) times a day. 120 tablet 2   ??? melphalan (ALKERAN) 2 mg tablet Take 5 tablets (10 mg total) by mouth Take as directed. Take 5 tabs days 1-4 of a 28-day cycle. (Patient not taking: Reported on 01/08/2018) 20 tablet 0   ???  metoprolol tartrate (LOPRESSOR) 25 MG tablet Take 0.5 tablets (12.5 mg total) by mouth Two (2) times a day. 30 tablet 0   ??? mirtazapine (REMERON) 45 MG tablet Take 1 tablet (45 mg total) by mouth nightly. 30 tablet 6   ??? ondansetron (ZOFRAN-ODT) 4 MG disintegrating tablet Take 1 tablet (4 mg total) by mouth every eight (8) hours as needed for nausea. 60 tablet 3   ??? oxyCODONE (ROXICODONE) 5 MG immediate release tablet      ??? polyethylene glycol (MIRALAX) 17 gram packet Take 17 g by mouth daily as needed. 10 packet 0   ??? potassium chloride (KLOR-CON) 10 MEQ CR tablet Take 2 tablets (20 mEq total) by mouth daily. 30 tablet 11   ??? predniSONE (DELTASONE) 50 MG tablet Take 2 tablets (100 mg total) by mouth daily. Days 1-4 of a 28-day cycle (Patient not taking: Reported on 01/08/2018) 8 tablet 1   ??? senna (SENOKOT) 8.6 mg tablet Take 1 tablet by mouth nightly as needed for constipation. 10 tablet 0   ??? silver sulfaDIAZINE (SILVADENE) 1 % cream Apply topically daily. 100 g 2   ??? sodium hypochlorite (DAKIN'S, HALF-STRENGTH,) external solution Apply topically daily. Use as a wound cleanser, pat dry, then apply dressing 473 mL 1   ??? telmisartan (MICARDIS) 40 MG tablet Take 40 mg by mouth daily.     ??? traMADol (ULTRAM) 50 mg tablet Take 0.5 tablets (25 mg total) by mouth every eight (8) hours as needed for pain. 60 tablet 0   ??? valACYclovir (VALTREX) 500 MG tablet Take 1 tablet (500 mg total) by mouth daily. 30 tablet 6   ??? zinc gluconate 100 mg Tab Take 100 mg by mouth daily at 0600. 30 each 11     No current facility-administered medications for this visit.          PHYSICAL EXAMINATION:  VITALS:   There were no vitals filed for this visit.    ECOG PERFORMANCE STATUS: 1    GENERAL:No acute distress. She is ambulatory without assistive devices today.  She is accompanied by her daughter, Misty Stanley.  HEENT: Anicteric sclera. No conjunctival pallor. No oropharyngeal exudates, erythema, nor ulceration.  Dentures are poorly fitted and affect her speech.  LYMPH: No cervical, supraclavicular, nor axillary adenopathy.  CV: S1,S2, no murmurs, rubs, nor gallops.    LUNGS: Speaking comfortably in full sentences without increased work of breathing. Clear to auscultation bilaterally.  ABDOMEN: Soft, non-tender, and non-distended. Normoactive bowel sounds. No hepatosplenomegaly.  EXTREMITIES: No edema.    LABS/IMAGING/OTHER TESTS:  Results for orders placed or performed in visit on 01/08/18   Comprehensive Metabolic Panel   Result Value Ref Range    Sodium 141 135 - 145 mmol/L    Potassium 4.7 3.5 - 5.0 mmol/L    Chloride 109 (H) 98 - 107 mmol/L    CO2 23.0 22.0 - 30.0 mmol/L    BUN 11 7 - 21 mg/dL    Creatinine 1.61 (H) 0.60 - 1.00 mg/dL    BUN/Creatinine Ratio 6     EGFR MDRD Non Af Amer 28 (L) >=60 mL/min/1.73m2    EGFR MDRD Af Amer 34 (L) >=60 mL/min/1.19m2    Anion Gap 9 9 - 15 mmol/L    Glucose 108 65 - 179 mg/dL    Calcium 9.1 8.5 - 09.6 mg/dL    Albumin 3.7 3.5 - 5.0 g/dL    Total Protein 9.3 (H) 6.5 - 8.3 g/dL    Total Bilirubin 0.4  0.0 - 1.2 mg/dL    AST 24 14 - 38 U/L    ALT 16 15 - 48 U/L    Alkaline Phosphatase 144 (H) 38 - 126 U/L   Immunoglobulin Free LT Chains Blood   Result Value Ref Range    Kappa Free, Serum 124.74 (H) 0.33 - 1.94 mg/dL    Lambda Free, Serum <9.62 (L) 0.57 - 2.63 mg/dL    K/L FLC Ratio  9.52 - 1.65   CBC w/ Differential   Result Value Ref Range    WBC 2.5 (L) 4.5 - 11.0 10*9/L    RBC 3.16 (L) 4.00 - 5.20 10*12/L    HGB 9.6 (L) 12.0 - 16.0 g/dL    HCT 84.1 (L) 32.4 - 46.0 %    MCV 95.4 80.0 - 100.0 fL    MCH 30.5 26.0 - 34.0 pg    MCHC 32.0 31.0 - 37.0 g/dL    RDW 40.1 (H) 02.7 - 15.0 %    MPV 9.5 7.0 - 10.0 fL    Platelet 179 150 - 440 10*9/L    Neutrophils % 71.7 %    Lymphocytes % 14.5 %    Monocytes % 6.4 %    Eosinophils % 2.4 %    Basophils % 0.1 %    Absolute Neutrophils 1.8 (L) 2.0 - 7.5 10*9/L    Absolute Lymphocytes 0.4 (L) 1.5 - 5.0 10*9/L    Absolute Monocytes 0.2 0.2 - 0.8 10*9/L    Absolute Eosinophils 0.1 0.0 - 0.4 10*9/L    Absolute Basophils 0.0 0.0 - 0.1 10*9/L    Large Unstained Cells 5 (H) 0 - 4 %    Microcytosis Slight (A) Not Present    Macrocytosis Marked (A) Not Present    Anisocytosis Marked (A) Not Present    Hypochromasia Slight (A) Not Present   MYELOMA WORKUP, SERUM   Result Value Ref Range    ALBUMIN (SPE) 3.4 (L) 3.5 - 5.0 g/dL    ALPHA-1 GLOBULIN 0.5 0.2 - 0.5 g/dL    ALPHA-2 GLOBULIN 0.8 0.5 - 1.1 g/dL    BETA-1 GLOBULIN 0.2 (L) 0.3 - 0.6 g/dL    BETA-2 GLOBULIN 0.2 0.2 - 0.6 g/dL    GAMMAGLOBULIN 4.6 (H) 0.5 - 1.5 g/dL    M Spike 4.2 (H) Not Present g/dL    SPE Interpretation      Immunofixation Electrophoresis, Serum      Total Protein 9.7 (H) 6.5 - 8.3 g/dL   Myeloma Workup Chemistries   Result Value Ref Range    Total Protein 9.7 (H) 6.5 - 8.3 g/dL    Total IgG 2,536 (H) 600-1,700 mg/dL    IgM <64 (L) 35 - 403 mg/dL    IgA 47.4 (L) 25.9 - 400.0 mg/dL   Morphology Review   Result Value Ref Range    Smear Review Comments See Comment (A) Undefined

## 2018-01-27 NOTE — Unmapped (Signed)
Labs drawn and sent for analysis, patient care provided by  Sampson Si

## 2018-01-27 NOTE — Unmapped (Addendum)
Please return in 4 weeks for a follow up visit and labs.    If you have any questions, please do not hesitate to contact us.    Please contact us for any of these symptoms:    1. New bone pain  2. Unintentional weight loss  3. Unexplained fatigue  4. Swelling of your legs  5. Yellowing of the skin or eyes    When reviewing your results, please remember that the results of many of the tests we order can vary somewhat and that variation often means nothing.  Sometimes when we get results back after your clinic visit, if it looks like there???s some variation of that type, we may decide to recheck things sooner than we discussed in clinic.  If you get a call that we want to recheck things sooner, do not panic. It does not mean that things are going wrong.    For appointments & questions Monday through Friday 8 AM??? 5 PM   please call [phone] or Toll free [phone].    On Nights, Weekends and Holidays  Call [phone] and ask for the adult hematologist/oncologist on call.    [name], AGPCNP-C, MSN, OCN  Nurse Practitioner  Hematologic Malignancies  [facility]    Nurse Navigator: Layla Maw, RN  Questions and appointments M-F 8am - 5pm: [phone] or [phone]    [facility]  [address]  [place], [place] [address]  [email or web address]    Results for orders placed or performed in visit on 01/27/18   Comprehensive Metabolic Panel   Result Value Ref Range    Sodium 138 135 - 145 mmol/L    Potassium 4.6 3.5 - 5.0 mmol/L    Chloride 106 98 - 107 mmol/L    CO2 23.0 22.0 - 30.0 mmol/L    BUN 23 (H) 7 - 21 mg/dL    Creatinine 3.66 4.40 - 1.00 mg/dL    BUN/Creatinine Ratio 23     EGFR MDRD Non Af Amer 54 (L) >=60 mL/min/1.80m2    EGFR MDRD Af Amer >=60 >=60 mL/min/1.6m2    Anion Gap 9 9 - 15 mmol/L    Glucose 118 65 - 179 mg/dL    Calcium 8.6 8.5 - 34.7 mg/dL    Albumin 3.6 3.5 - 5.0 g/dL    Total Protein 8.5 (H) 6.5 - 8.3 g/dL    Total Bilirubin 0.6 0.0 - 1.2 mg/dL    AST 17 14 - 38 U/L ALT 39 15 - 48 U/L    Alkaline Phosphatase 69 38 - 126 U/L   CBC w/ Differential   Result Value Ref Range    WBC 5.4 4.5 - 11.0 10*9/L    RBC 3.10 (L) 4.00 - 5.20 10*12/L    HGB 10.3 (L) 12.0 - 16.0 g/dL    HCT 42.5 (L) 95.6 - 46.0 %    MCV 99.9 80.0 - 100.0 fL    MCH 33.2 26.0 - 34.0 pg    MCHC 33.2 31.0 - 37.0 g/dL    RDW 38.7 (H) 56.4 - 15.0 %    MPV 9.2 7.0 - 10.0 fL    Platelet 71 (L) 150 - 440 10*9/L    Neutrophils % 94.0 %    Lymphocytes % 2.0 %    Monocytes % 2.6 %    Eosinophils % 0.5 %    Basophils % 0.3 %    Neutrophil Left Shift 2+ (A) Not Present    Absolute Neutrophils 5.0 2.0 -  7.5 10*9/L    Absolute Lymphocytes 0.1 (L) 1.5 - 5.0 10*9/L    Absolute Monocytes 0.1 (L) 0.2 - 0.8 10*9/L    Absolute Eosinophils 0.0 0.0 - 0.4 10*9/L    Absolute Basophils 0.0 0.0 - 0.1 10*9/L    Large Unstained Cells 1 0 - 4 %    Microcytosis Slight (A) Not Present    Macrocytosis Marked (A) Not Present    Anisocytosis Marked (A) Not Present   Morphology Review   Result Value Ref Range    Smear Review Comments See Comment (A) Undefined    [place] Present (A) Not Present

## 2018-01-29 LAB — MYELOMA WORKUP, SERUM
ALBUMIN (SPE): 3.5 g/dL (ref 3.5–5.0)
ALPHA-1 GLOBULIN: 0.3 g/dL (ref 0.2–0.5)
ALPHA-2 GLOBULIN: 0.8 g/dL (ref 0.5–1.1)
BETA-1 GLOBULIN: 0.2 g/dL — ABNORMAL LOW (ref 0.3–0.6)
BETA-2 GLOBULIN: 0.2 g/dL (ref 0.2–0.6)
GAMMAGLOBULIN: 3.5 g/dL — ABNORMAL HIGH (ref 0.5–1.5)

## 2018-01-29 LAB — BETA-1 GLOBULIN: Lab: 0.2 — ABNORMAL LOW

## 2018-02-02 ENCOUNTER — Encounter: Admit: 2018-02-02 | Discharge: 2018-02-03 | Payer: MEDICARE | Attending: Vascular Surgery | Primary: Vascular Surgery

## 2018-02-02 ENCOUNTER — Ambulatory Visit: Admit: 2018-02-02 | Discharge: 2018-02-03 | Payer: MEDICARE | Attending: Podiatrist | Primary: Podiatrist

## 2018-02-02 DIAGNOSIS — I739 Peripheral vascular disease, unspecified: Principal | ICD-10-CM

## 2018-02-02 DIAGNOSIS — B351 Tinea unguium: Secondary | ICD-10-CM

## 2018-02-02 DIAGNOSIS — M79675 Pain in left toe(s): Secondary | ICD-10-CM

## 2018-02-02 DIAGNOSIS — Z89422 Acquired absence of other left toe(s): Principal | ICD-10-CM

## 2018-02-02 DIAGNOSIS — L84 Corns and callosities: Secondary | ICD-10-CM

## 2018-02-02 DIAGNOSIS — M79674 Pain in right toe(s): Secondary | ICD-10-CM

## 2018-02-02 NOTE — Unmapped (Addendum)
A:   neuropathy  PAD  Amputation of toes   Pain in toe right and left  Onychomycosis  Callus foot    P:  11721 debridement of remaining toenails by clipping and grinding x 8    11055 paring of benign lesion of callus of foot at 1  site listed below      f/u 9 weeks approximately [place] dpm    S: Pain in toes and difficult to wear shoes and walk due to toenails  History of diabetes and PAD and last seen by managing provider within last 6 months Dr Karie Mainland     O: Vascular   Thin shiny skin, loss of digital hair, sub Q fat atrophy, edema, rubor on dependency bilateral  Pulses non palpable of DP and PT artery bilateral  Derm:   Nails are thick, elongated and mycotic with white chalky debris, peri nail erythema and ingrown with tenderness x 8 toes    Calluses at locations rt foot amp site     No interdigital maceration, cellulitis,  Or open wounds.    Arterial flow studies reviewed in epic findings are consistent with PAD            Allergies:  No Known Allergies      Medications:   Outpatient Encounter Medications as of 02/02/2018   Medication Sig Dispense Refill   ??? acetaminophen (TYLENOL) 325 MG tablet Take 2 tablets (650 mg total) by mouth every six (6) hours as needed. 30 tablet 0   ??? alendronate (FOSAMAX) 70 MG tablet Take 1 tablet by mouth once a week. Takes on Tuesday AM     ??? amLODIPine (NORVASC) 5 MG tablet Take 5 mg by mouth daily.      ??? clopidogrel (PLAVIX) 75 mg tablet Take 1 tablet (75 mg total) by mouth daily. 30 tablet 1   ??? colchicine (COLCRYS) 0.6 mg tablet Take 1 tablet (0.6 mg total) by mouth Every Monday, Wednesday, and Friday. 12 tablet 3   ??? dexamethasone (DECADRON) 4 MG tablet Take 2 tablets (8 mg total) by mouth 2 (two) times a day with meals. 120 tablet 0   ??? magnesium oxide (MAG-OX) 400 mg tablet Take 1 tablet (400 mg total) by mouth Two (2) times a day. 120 tablet 2   ??? metoprolol tartrate (LOPRESSOR) 25 MG tablet Take 0.5 tablets (12.5 mg total) by mouth Two (2) times a day. 30 tablet 0 ??? mirtazapine (REMERON) 45 MG tablet Take 1 tablet (45 mg total) by mouth nightly. 30 tablet 6   ??? ondansetron (ZOFRAN-ODT) 4 MG disintegrating tablet Take 1 tablet (4 mg total) by mouth every eight (8) hours as needed for nausea. 60 tablet 3   ??? oxyCODONE (ROXICODONE) 5 MG immediate release tablet      ??? polyethylene glycol (MIRALAX) 17 gram packet Take 17 g by mouth daily as needed. 10 packet 0   ??? potassium chloride (KLOR-CON) 10 MEQ CR tablet Take 2 tablets (20 mEq total) by mouth daily. 30 tablet 11   ??? senna (SENOKOT) 8.6 mg tablet Take 1 tablet by mouth nightly as needed for constipation. 10 tablet 0   ??? silver sulfaDIAZINE (SILVADENE) 1 % cream Apply topically daily. 100 g 2   ??? sodium hypochlorite (DAKIN'S, HALF-STRENGTH,) external solution Apply topically daily. Use as a wound cleanser, pat dry, then apply dressing 473 mL 1   ??? telmisartan (MICARDIS) 40 MG tablet Take 40 mg by mouth daily.     ??? traMADol Janean Sark)  50 mg tablet Take 0.5 tablets (25 mg total) by mouth every eight (8) hours as needed for pain. 60 tablet 0   ??? valACYclovir (VALTREX) 500 MG tablet Take 1 tablet (500 mg total) by mouth daily. 30 tablet 6   ??? zinc gluconate 100 mg Tab Take 100 mg by mouth daily at 0600. 30 each 11     No facility-administered encounter medications on file as of 02/02/2018.        Medical History:  Past Medical History:   Diagnosis Date   ??? Arthritis    ??? High cholesterol    ??? Hypertension    ??? Multiple myeloma (CMS-HCC) 01/18/2014       Surgical History:  Past Surgical History:   Procedure Laterality Date   ??? negative surgical history     ??? PR AMPUTATION TOE,MT-P JT Right 10/24/2017    Procedure: AMPUTATION, TOE; METATARSOPHALANGEAL JOINT;  Surgeon: Boykin Reaper, MD;  Location: MAIN OR [facility];  Service: Vascular       Social History:  Social History     Socioeconomic History   ??? Marital status: Divorced     Spouse name: Not on file   ??? Number of children: Not on file   ??? Years of education: Not on file   ??? Highest education level: Not on file   Occupational History   ??? Not on file   Social Needs   ??? Financial resource strain: Not on file   ??? Food insecurity:     Worry: Not on file     Inability: Not on file   ??? Transportation needs:     Medical: Not on file     Non-medical: Not on file   Tobacco Use   ??? Smoking status: Former Smoker     Packs/day: 0.50     Last attempt to quit: 01/17/2014     Years since quitting: 4.0   ??? Smokeless tobacco: Never Used   Substance and Sexual Activity   ??? Alcohol use: No   ??? Drug use: No   ??? Sexual activity: Never     Birth control/protection: Post-menopausal   Lifestyle   ??? Physical activity:     Days per week: Not on file     Minutes per session: Not on file   ??? Stress: Not on file   Relationships   ??? Social connections:     Talks on phone: Not on file     Gets together: Not on file     Attends religious service: Not on file     Active member of club or organization: Not on file     Attends meetings of clubs or organizations: Not on file     Relationship status: Not on file   ??? Intimate partner violence:     Fear of current or ex partner: Not on file     Emotionally abused: Not on file     Physically abused: Not on file     Forced sexual activity: Not on file   Other Topics Concern   ??? Not on file   Social History Narrative   ??? Not on file       Family History:  Family History   Problem Relation Age of Onset   ??? Hypertension Mother    ??? Hypertension Sister    ??? Hypertension Brother

## 2018-02-02 NOTE — Unmapped (Signed)
Diabetes Foot Health: Care Instructions  Your Care Instructions    When you have diabetes, your feet need extra care and attention. Diabetes can damage the nerve endings and blood vessels in your feet, making you less likely to notice when your feet are injured. Diabetes also limits your body's ability to fight infection and get blood to areas that need it. If you get a minor foot injury, it could become an ulcer or a serious infection. With good foot care, you can prevent most of these problems.  Caring for your feet can be quick and easy. Most of the care can be done when you are bathing or getting ready for bed.  Follow-up care is a key part of your treatment and safety. Be sure to make and go to all appointments, and call your doctor if you are having problems. It's also a good idea to know your test results and keep a list of the medicines you take.  How can you care for yourself at home?  ?? Keep your blood sugar close to normal by watching what and how much you eat, monitoring blood sugar, taking medicines if prescribed, and getting regular exercise.  ?? Do not smoke. Smoking affects blood flow and can make foot problems worse. If you need help quitting, talk to your doctor about stop-smoking programs and medicines. These can increase your chances of quitting for good.  ?? Eat a diet that is low in fats. High fat intake can cause fat to build up in your blood vessels and decrease blood flow.  ?? Inspect your feet daily for blisters, cuts, cracks, or sores. If you cannot see well, use a mirror or have someone help you.  ?? Take care of your feet:  ? Wash your feet every day. Use warm (not hot) water. Check the water temperature with your wrists or other part of your body, not your feet.  ? Dry your feet well. Pat them dry. Do not rub the skin on your feet too hard. Dry well between your toes. If the skin on your feet stays moist, bacteria or a fungus can grow, which can lead to infection.  ? Keep your skin soft. Use moisturizing skin cream to keep the skin on your feet soft and prevent calluses and cracks. But do not put the cream between your toes, and stop using any cream that causes a rash.  ? Clean underneath your toenails carefully. Do not use a sharp object to clean underneath your toenails. Use the blunt end of a nail file or other rounded tool.  ? Trim and file your toenails straight across to prevent ingrown toenails. Use a nail clipper, not scissors. Use an emery board to smooth the edges.  ?? Change socks daily. Socks without seams are best, because seams often rub the feet. You can find socks for people with diabetes from specialty catalogs.  ?? Look inside your shoes every day for things like gravel or torn linings, which could cause blisters or sores.  ?? Buy shoes that fit well:  ? Look for shoes that have plenty of space around the toes. This helps prevent bunions and blisters.  ? Try on shoes while wearing the kind of socks you will usually wear with the shoes.  ? Avoid plastic shoes. They may rub your feet and cause blisters. Good shoes should be made of materials that are flexible and breathable, such as leather or cloth.  ? Break in new shoes slowly by wearing them  for no more than an hour a day for several days. Take extra time to check your feet for red areas, blisters, or other problems after you wear new shoes.  ?? Do not go barefoot. Do not wear sandals, and do not wear shoes with very thin soles. Thin soles are easy to puncture. They also do not protect your feet from hot pavement or cold weather.  ?? Have your doctor check your feet during each visit. If you have a foot problem, see your doctor. Do not try to treat an early foot problem at home. Home remedies or treatments that you can buy without a prescription (such as corn removers) can be harmful.  ?? Always get early treatment for foot problems. A minor irritation can lead to a major problem if not properly cared for early.  When should you call for help?  Call your doctor now or seek immediate medical care if:  ?? ?? You have a foot sore, an ulcer or break in the skin that is not healing after 4 days, bleeding corns or calluses, or an ingrown toenail.   ?? ?? You have blue or black areas, which can mean bruising or blood flow problems.   ?? ?? You have peeling skin or tiny blisters between your toes or cracking or oozing of the skin.   ?? ?? You have a fever for more than 24 hours and a foot sore.   ?? ?? You have new numbness or tingling in your feet that does not go away after you move your feet or change positions.   ?? ?? You have unexplained or unusual swelling of the foot or ankle.   ??Watch closely for changes in your health, and be sure to contact your doctor if:  ?? ?? You cannot do proper foot care.   Where can you learn more?  Go to [facility] at [email or web address]  Select Preferences in the upper right hand corner, then select Health Library under Resources. Enter A739 in the search box to learn more about Diabetes Foot Health: Care Instructions.  Current as of: May 13, 2017  Content Version: 12.0  ?? 2006-2019 [employer]. Care instructions adapted under license by [facility]. If you have questions about a medical condition or this instruction, always ask your healthcare professional. [employer] disclaims any warranty or liability for your use of this information.

## 2018-02-02 NOTE — Unmapped (Signed)
BP 192/90  - Pulse 99  - Temp 36.8 ??C (98.2 ??F) (Temporal)  - Ht 170.2 cm (5' 7)  - Wt 60.3 kg (133 lb) Comment: stated - BMI 20.83 kg/m??     Wound 12/29/17 Foot Right 4-5th toe amp site (Active)   Wound Image   02/02/2018  9:41 AM   Wound Status Not Healed 02/02/2018  9:39 AM   Pain 0 02/02/2018  9:39 AM   Dressing Status      Other (Comment);Removed 02/02/2018  9:39 AM   Wound Length (cm) 0 cm 02/02/2018  9:39 AM   Wound Width (cm) 0 cm 02/02/2018  9:39 AM   Wound Depth (cm) 0 cm 02/02/2018  9:39 AM   Wound Surface Area (cm^2) 0 cm^2 02/02/2018  9:39 AM   Wound Volume (cm^3) 0 cm^3 02/02/2018  9:39 AM   Wound Length (cm) 0.4 01/11/2018  8:30 AM   Wound Width (cm)      0.1 01/11/2018  8:30 AM   Wound Depth (cm)      0.4 01/11/2018  8:30 AM   Area (sq cm) (Calculated) 0.04 01/11/2018  8:30 AM   Volume (mL) (Calculated) 0.02 01/11/2018  8:30 AM   RETIRED - Wound Length % Healed (Calculated) 80.95 % 01/11/2018  8:30 AM   RETIRED - Wound Width % Healed (Calculated) 91.67 % 01/11/2018  8:30 AM   RETIRED - Wound Depth % Healed (Calculated) -300 % 01/11/2018  8:30 AM   RETIRED - Area (sq cm) % Healed (Calculated) 98.41 % 01/11/2018  8:30 AM   RETIRED - Volume (mL) % Healed (Calculated) 98.41 % 01/11/2018  8:30 AM   Odor None 01/11/2018  8:30 AM   Margins Undefined edges 01/11/2018  8:30 AM   Peri-wound Assessment      Hemosiderin staining 01/11/2018  8:30 AM   Encounter Subsequent 01/11/2018  8:30 AM   Slough % 76-100% 01/11/2018  8:30 AM   Eschar % None 01/11/2018  8:30 AM   Epithelialization % None 01/11/2018  8:30 AM   Granulation % None 01/11/2018  8:30 AM   Exudate Type      Sero-sanguineous 01/11/2018  8:30 AM   Exudate Amnt      Scant 01/11/2018  8:30 AM   Exposed Structure N/A 01/11/2018  8:30 AM   Paring No 01/11/2018  8:30 AM   Texture Callus 01/11/2018  8:30 AM   Moisture Normal For Patient 01/11/2018  8:30 AM   Temperature WNL 01/11/2018  8:30 AM   Hypergranuation No 01/11/2018  8:30 AM   Tunneling      No 01/11/2018  8:30 AM   Undermining No 01/11/2018  8:30 AM   Sinus Tract No 01/11/2018  8:30 AM   Treatments Cleansed/Irrigation 01/11/2018  8:30 AM   Picture Taken Yes 01/11/2018  8:30 AM   Dressing Other (Comment) 01/11/2018  9:24 AM   Length (Pre Debridement) 0.4 cm 01/11/2018  9:24 AM   Width (Pre Debridement) 0.1 cm 01/11/2018  9:24 AM   Depth (Pre Debridement) 0.4 cm 01/11/2018  9:24 AM   Pre-Procedure Pain 0 01/11/2018  9:24 AM   Time Out Taken Yes 01/11/2018  9:24 AM   Instrument Used scissors;forceps 01/11/2018  9:24 AM   Tissue/Material Removed non-viable;callus 01/11/2018  9:24 AM   Bleeding none 01/11/2018  9:24 AM   Bleeding controlled with n/a 01/11/2018  9:24 AM   Specimen Taken none 01/11/2018  9:24 AM   Type of Debridement selective 01/11/2018  9:24  AM   Level of Debridement skin, dermis 01/11/2018  9:24 AM   Procedural Pain 0 01/11/2018  9:24 AM   Length (Post-Debridement) 1.5 cm 01/11/2018  9:24 AM   Width (Post Debridement) 0.8 cm 01/11/2018  9:24 AM   Depth (Post-Debridement) 0.3 cm 01/11/2018  9:24 AM   Area(Pre-Debridement) 0.04 sq cm 01/11/2018  9:24 AM   Area(Post-Debridement) 1.2 sq cm 01/11/2018  9:24 AM   Volume(Pre-Debridement) 0.02 sq cm 01/11/2018  9:24 AM   Volume(Post-Debridement) 0.36 sq cm 01/11/2018  9:24 AM     This is a patient who had amputation of the fourth and fifth toes of the right foot.  She had an ulcer in this area.  This is now healed.    I listen with a hand-held Doppler and she has decreased signals on the right posterior tibial and dorsalis pedis and an absent signal in the anterior tibial on the left the posterior tibial on the left side is good.    The wound is completely healed.    Impression healed wound in a patient with peripheral arterial disease    Plan we will see her back in clinic in 3 months for a routine check.  I told her that we will not pursue the peripheral arterial disease diagnosis since she has no pain and the ulcer is healed.

## 2018-02-10 MED ORDER — MELPHALAN 2 MG TABLET
ORAL_TABLET | ORAL | 2 refills | 0.00000 days | Status: CP
Start: 2018-02-10 — End: 2018-04-21

## 2018-02-10 MED ORDER — IXAZOMIB 3 MG CAPSULE: each | 2 refills | 0 days

## 2018-02-10 MED ORDER — PREDNISONE 50 MG TABLET: tablet | 2 refills | 0 days | Status: AC

## 2018-02-10 MED ORDER — CLOPIDOGREL 75 MG TABLET
ORAL_TABLET | Freq: Every day | ORAL | 30 refills | 0.00000 days | Status: CP
Start: 2018-02-10 — End: 2018-04-21

## 2018-02-10 MED ORDER — PREDNISONE 50 MG TABLET
ORAL_TABLET | ORAL | 2 refills | 0.00000 days | Status: CP
Start: 2018-02-10 — End: 2018-04-21

## 2018-02-10 MED ORDER — MELPHALAN 2 MG TABLET: each | 2 refills | 0 days

## 2018-02-10 MED ORDER — IXAZOMIB 3 MG CAPSULE
ORAL_CAPSULE | 2 refills | 0.00000 days | Status: CP
Start: 2018-02-10 — End: 2018-03-24

## 2018-02-10 NOTE — Unmapped (Signed)
New RXs received for Ninlaro ($0), Melphalan ($34.36), and prednisone ($0)

## 2018-02-10 NOTE — Unmapped (Signed)
[facility] Refill and Clinical Coordination Note  Medication(s): Ninlaro 3mg , melphalan 2mg     Kayla Torres, DOB: Jun 07, 1941  Phone: [phone] (home) , Alternate phone contact: N/A  Shipping address: [address]  [place] [address]  Phone or address changes today?: No  All above HIPAA information verified.  Insurance changes? No    Completed refill and clinical call assessment today to schedule patient's medication shipment from the [facility] [phone]).      MEDICATION RECONCILIATION    Confirmed the medication and dosage are correct and have not changed: No, patient reports changes to the regimen as follows: Ninlaro is now 3mg  per dose    Were there any changes to your medication(s) in the past month:  No, there are no changes reported at this time.    ADHERENCE    Is this medicine transplant or covered by Medicare Part B? No.    Did you miss any doses in the past 4 weeks? No missed doses reported.  Adherence counseling provided? Not needed     SIDE EFFECT MANAGEMENT    Are you tolerating your medication?:  Kayla Torres reports tolerating the medication.  Side effect management discussed: None      Therapy is appropriate and should be continued.    Evidence of clinical benefit: Conversation between Kayla Torres, CPP in clinic and [facility] staff to call patient about prescriptions written today to restart treatment at these new doses.      FINANCIAL/SHIPPING    Delivery Scheduled: Yes, Expected medication delivery date: 02/12/18   Additional medications refilled: No additional medications/refills needed at this time.    The patient will receive an FSI print out for each medication shipped and additional FDA Medication Guides as required.  Patient education from [place] or Robet Leu may also be included in the shipment.    [name] did not have any additional questions at this time.    Delivery address validated in FSI scheduling system: Yes, address listed above is correct. We will follow up with patient monthly for standard refill processing and delivery.      Thank you,  Kayla Torres   [facility] Specialty Pharmacist

## 2018-02-11 MED FILL — NINLARO/3MG/CAPS: NINLARO/3MG/CAPS | 28 days supply | Qty: 3 | Fill #0

## 2018-02-11 MED FILL — PREDNISONE/50MG/TAB: PREDNISONE/50MG/TAB | 28 days supply | Qty: 8 | Fill #0

## 2018-02-11 MED FILL — MELPHALAN/2MG/TABS: MELPHALAN/2MG/TABS | 28 days supply | Qty: 20 | Fill #0

## 2018-02-12 NOTE — Unmapped (Signed)
Called patient to confirm Melphalan, Ixazomib, and Prednisone were delivered today. Patient stated medication was delivered without issue and plans to start medication tonight (02/12/18).    Reminded patient of follow-up appointment scheduled for 02/24/2018 with [name]. Patient expressed understanding and plans to come to clinic that day.     [place] Ulric Salzman, PharmD, BCPS, BCOP, CPP  Clinical Pharmacist Practitioner, Myeloma and Lymphoma  Pager: [phone]

## 2018-02-23 MED ORDER — MIRTAZAPINE 45 MG TABLET
ORAL_TABLET | Freq: Every evening | ORAL | 6 refills | 0 days | Status: CP
Start: 2018-02-23 — End: 2018-03-24

## 2018-02-24 ENCOUNTER — Encounter: Admit: 2018-02-24 | Discharge: 2018-02-24 | Payer: MEDICARE

## 2018-02-24 ENCOUNTER — Encounter: Admit: 2018-02-24 | Discharge: 2018-02-24 | Payer: MEDICARE | Attending: Adult Health | Primary: Adult Health

## 2018-02-24 DIAGNOSIS — D61818 Other pancytopenia: Secondary | ICD-10-CM

## 2018-02-24 DIAGNOSIS — C9000 Multiple myeloma not having achieved remission: Secondary | ICD-10-CM

## 2018-02-24 DIAGNOSIS — N183 Chronic kidney disease, stage 3 (moderate): Secondary | ICD-10-CM

## 2018-02-24 DIAGNOSIS — C9002 Multiple myeloma in relapse: Principal | ICD-10-CM

## 2018-02-24 DIAGNOSIS — G620 Drug-induced polyneuropathy: Secondary | ICD-10-CM

## 2018-02-24 DIAGNOSIS — D701 Agranulocytosis secondary to cancer chemotherapy: Secondary | ICD-10-CM

## 2018-02-24 DIAGNOSIS — R531 Weakness: Secondary | ICD-10-CM

## 2018-02-24 DIAGNOSIS — T451X5A Adverse effect of antineoplastic and immunosuppressive drugs, initial encounter: Secondary | ICD-10-CM

## 2018-02-24 LAB — MEAN CORPUSCULAR HEMOGLOBIN: Lab: 36.4 — ABNORMAL HIGH

## 2018-02-24 LAB — MYELOMA SERUM CHEMISTRIES
GAMMAGLOBULIN; IGM: <25 mg/dL — ABNORMAL LOW (ref 35–290)
PROTEIN TOTAL: 7.7 g/dL (ref 6.5–8.3)

## 2018-02-24 LAB — CBC W/ AUTO DIFF
BASOPHILS ABSOLUTE COUNT: 0 10*9/L (ref 0.0–0.1)
BASOPHILS RELATIVE PERCENT: 1 %
EOSINOPHILS ABSOLUTE COUNT: 0 10*9/L (ref 0.0–0.4)
EOSINOPHILS RELATIVE PERCENT: 1 %
HEMATOCRIT: 23.4 % — ABNORMAL LOW (ref 36.0–46.0)
HEMOGLOBIN: 8 g/dL — ABNORMAL LOW (ref 12.0–16.0)
LARGE UNSTAINED CELLS: 8 % — ABNORMAL HIGH (ref 0–4)
LYMPHOCYTES RELATIVE PERCENT: 56 %
MEAN CORPUSCULAR HEMOGLOBIN CONC: 34.1 g/dL (ref 31.0–37.0)
MEAN CORPUSCULAR HEMOGLOBIN: 36.4 pg — ABNORMAL HIGH (ref 26.0–34.0)
MEAN PLATELET VOLUME: 9 fL (ref 7.0–10.0)
MONOCYTES ABSOLUTE COUNT: 0 10*9/L — ABNORMAL LOW (ref 0.2–0.8)
MONOCYTES RELATIVE PERCENT: 6 %
NEUTROPHILS RELATIVE PERCENT: 28 %
PLATELET COUNT: 6 10*9/L — CL (ref 150–440)
RED BLOOD CELL COUNT: 2.19 10*12/L — ABNORMAL LOW (ref 4.00–5.20)
RED CELL DISTRIBUTION WIDTH: 20.7 % — ABNORMAL HIGH (ref 12.0–15.0)
WBC ADJUSTED: 0.1 10*9/L — CL (ref 4.5–11.0)

## 2018-02-24 LAB — ALT (SGPT): Alanine aminotransferase:CCnc:Pt:Ser/Plas:Qn:: 23

## 2018-02-24 LAB — COMPREHENSIVE METABOLIC PANEL
ALBUMIN: 3.1 g/dL — ABNORMAL LOW (ref 3.5–5.0)
ALT (SGPT): 23 U/L (ref 15–48)
ANION GAP: 7 mmol/L — ABNORMAL LOW (ref 9–15)
AST (SGOT): 13 U/L — ABNORMAL LOW (ref 14–38)
BILIRUBIN TOTAL: 0.9 mg/dL (ref 0.0–1.2)
BLOOD UREA NITROGEN: 24 mg/dL — ABNORMAL HIGH (ref 7–21)
BUN / CREAT RATIO: 15
CALCIUM: 8.4 mg/dL — ABNORMAL LOW (ref 8.5–10.2)
CHLORIDE: 106 mmol/L (ref 98–107)
EGFR MDRD AF AMER: 39 mL/min/{1.73_m2} — ABNORMAL LOW (ref >=60)
EGFR MDRD NON AF AMER: 32 mL/min/{1.73_m2} — ABNORMAL LOW (ref >=60)
GLUCOSE RANDOM: 102 mg/dL (ref 65–179)
POTASSIUM: 5.2 mmol/L — ABNORMAL HIGH (ref 3.5–5.0)
PROTEIN TOTAL: 7.7 g/dL (ref 6.5–8.3)
SODIUM: 136 mmol/L (ref 135–145)

## 2018-02-24 LAB — IMMUNOGLOBULIN FREE LT CHAINS BLOOD: KAPPA FREE,SERUM: 69.59 mg/dL — ABNORMAL HIGH (ref 0.33–1.94)

## 2018-02-24 LAB — MAGNESIUM: Magnesium:MCnc:Pt:Ser/Plas:Qn:: 2.2

## 2018-02-24 LAB — GAMMAGLOBULIN; IGG: IgG:MCnc:Pt:Ser/Plas:Qn:: 3452 — ABNORMAL HIGH

## 2018-02-24 LAB — KAPPA FREE,SERUM: Lab: 69.59 — ABNORMAL HIGH

## 2018-02-24 LAB — PLATELET COUNT: Lab: 69 — ABNORMAL LOW

## 2018-02-24 LAB — SMEAR REVIEW

## 2018-02-24 MED ORDER — MAGNESIUM OXIDE 400 MG (241.3 MG MAGNESIUM) TABLET
ORAL_TABLET | Freq: Two times a day (BID) | ORAL | 2 refills | 0 days | Status: CP
Start: 2018-02-24 — End: 2018-04-21

## 2018-02-24 NOTE — Unmapped (Signed)
0840 Labs drawn and sent for analysis.  Care provided by [email or web address]

## 2018-02-24 NOTE — Unmapped (Signed)
Pt received 2 units of RBC and 1 unit of Platelets. VS stable throughout. Pt received neulasta injection into left arm. PIV removed and pt left in wheelchair with family

## 2018-02-24 NOTE — Unmapped (Signed)
[facility] Follow Up      REASON FOR VISIT: Kayla Torres presents today for follow-up of multiple myeloma, having progressed on all available lines of therapy.    ASSESSMENT:  #1 IgG kappa symptomatic multiple myeloma - ISS Stage II    #2 CKD, Stage 3B, moderate (CrCl estimated 32.1 mL/min).  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   #6 Pancytopenia 2/2 melphalan + ixazomib - resolving     Current Treatment: retrialing mel/ixa/pred  Best Response: mixed repsonse  Current Response: tbd    Kayla Torres is doing well overall.  She has tolerated Mel/Ixa/Pred fairly well again, with no side effects other than pancytopenia.  Most concerning is the new onset weakness that she's been experiencing for about 2 weeks.  Does not seem drug related, as she has not started any new medications and she did not have this problem last cycle.  Dr. Anise Salvo is concerned that she could be progressing, since she did present with weakness.  Her lab work does seem to contradict this - M-spike down to 2.8 g/dL, total protein down to 7.7, IgG has also gone from 4285 to 3452 at this visit.      Today we will transfuse 2u PRBCs, 1u platelets, and give Neulasta SQ.  These will help support her counts as she recovers from the myelosuppression.  She will not take her last Ixazomib dose due to pancytopenia.    Also reports new onset pins and needles feeling in her feet bilaterally x 2 weeks.    She will return in 2 weeks for follow up with Dr. Anise Salvo, who will discuss goals of care.      RECOMMENDATIONS:  1. Hold last dose of Ixa  2. Continue valacyclovir 500mg  po daily for zoster prophy  3. Neulasta today  4. Transfuse 2u PRBC, 1u platelets  5. PT referral  6. Follow up in 2 weeks    [name], AGPCNP-C, MSN, [facility]  Nurse Practitioner  Hematologic Malignancies  [facility]  [phone] (phone)  [phone] (fax)  [email or web address]            ONCOLOGY HISTORY:  Oncology History    Diagnosis:  Symptomatic IgG kappa multiple myeloma, 01/11/14.  Kayla Torres was diagnosed with symptomatic myeloma at the age of 77 year old when she presented with symptomatic hypercalcemia.  Evaluation revealed IgG kappa symptomatic multiple myeloma based upon SPEP M-protein of 2.5 g/dL, anemia, hypercalcemia, and lytic lesions documented on skeletal survey. Bone marrow biopsy performed 01/11/14 showed normocellular bone marrow (60%) with involvement by plasma cell dyscrasia (35% plasma cells by aspirate differential, 80% plasma cells by CD138 immunohistochemical analysis of clot, and monotypic kappa by in-situ hybridization).  IgG elevated at 3208 mg/dL, IgM <29, IgA 562.     Stage @ Diagnosis: ISS 2 (B2M 4.11).   ISS I (B2M <3.5 mg/dL and albumin >1.3 g/dL) - Median OS 62 months  ISS II - neither I nor III. - Median OS 44 months  ISS III - B2M >5.5 mg/dL. - Median OS 29 months      Risk Stratification:  High risk based upon +1q  Cytogenetics:    Normal karyotype: 46,XY,inv(9)(p11q13)c[20]   Abnormal FISH: A multiple myeloma FISH panel, on a CD138+ plasma cell-enriched fraction, performed at [facility]) was abnormal. A percentage of the cells were positive for three 1q  signals, monosomy 13, and trisomy 9 and 15. Gain of 1q is associated with a worse prognosis in myeloma patients.     LDH: normal at diagnosis    Treatment History/Disease Course:  1. CyBorD - x 9 cycles. VGPR, best response.  2. Lenalidomide maintenance - 10/2014 - n/v/malaise --> AKI. Subsequently changed to bortezomib q2week maintenance --> PD 07/25/15, with M-spike increased from TLTQ to 2.0 g/dL.    3. Carfilzomib 36mg /m2 + dexamethasone  TTE 07/2015 with normal EF. Addition of revlimid 15 mg 21/28 day cycle on C2. Progression 02/27/16  4. Daratumumab initiated 03/05/16 - PD  5. Cy/Pom/Dex - 04/16/16  6. Benda/Pom/Dex 03/10/17        Bone Health:  - Myeloma survey from 01/09/14: Lateral views of the calvarium show multiple small lytic lesions throughout calvarium. There is diffuse osteopenia throughout the spine. A severe compression fracture at L1 includes mild loss of the posterior vertebral body height but no listhesis. There is also endplate invagination superiorly and inferiorly at L5.  AP views of the appendicular skeleton again show diffuse osteopenia. Lytic lesions are present in the proximal and distal right femur, proximal left femur and left proximal humerus. No pathologic fracture is seen at this time.  - Bisphosphonate:  Received pamidronate in 12/2013 while hospitalized.  Starting monthly zoledronic acid 4mg  IV monthly x 2 years on 02/22/14.    Transplant Status:  Not transplanted; had initial visit with Dr. Lucretia Roers on 04/03/14. Elected to forgo cell collection or transplant.         Multiple myeloma in relapse (CMS-HCC)    01/11/2014 Initial Diagnosis     Multiple myeloma, IgG kappa.  ISS 2.  Presented with hypercalcemia and anemia.  Renal failure corrected after IV hydration.  M-spike 2.5 g/dL         4/0/9811 -  Chemotherapy     CyBorD         02/22/2014 Endocrine/Hormone Therapy     Zometa Therapy 4 mg IV monthly. Plan for two years of therapy         10/27/2014 -  Chemotherapy     Bortezomib maintenance 1.3 mg/m2 [place] q2 weeks         07/25/2015 Progression     M-spike 2.0.  CKD stable, cr 1.45 mg/dL.  Hgb 10.6, slightly reduced from steady-state.         08/08/2015 - 02/27/2016 Chemotherapy     Carfilzomib + dexamethaonse with addition of revlimid 15 mg 21/28 day cycle on C2. 10/03/15 C3 revlimid discontinued after one cycle (C2). C5 delayed a week for URI.         02/17/2016 Progression            02/27/2016 Progression     M-spike to 2.3         03/05/2016 -  Chemotherapy     Single agent Daratumumab         04/14/2016 Progression     M-spike 3.3 up from 2.3 on single agent dara         04/15/2016 -  Chemotherapy Initiated pom/cy/dex; Pom 4mg  d1-21, Cytoxan 400mg  PO days 1, 8, 15, dex 20mg  PO days 1, 8, 15, 22         05/05/2016 -  Chemotherapy     Dose reduced pomalidomide to 2mg  d/t AKI         03/10/2017 -  Chemotherapy     Benda/Pom/Dex         10/09/2017 Progression  M-spike 1.4.  Initiation of treatment delayed by gangrene of right 4th and 5th toes, with hospitalization            Chemotherapy     Ixazomib/Melphalan/Prednisone  M-spike 3.4            INTERVAL HISTORY:  Kayla Torres presents to clinic today with her daughter for follow up.  She is currently mid cycle of Mel/Ixa/Pred.  She was restarted on this two weeks ago when it was found that she had a very good response to the chemotherapy.  She was willing to retry this chemotherapy as we were waiting for compassionate use BCMA to be available.  Doing fair today, though is having some constipation.  Has noticed weakness in both arms and legs for about 1 wee.  Unable to lift her legs without assistance or move to a standing position without help.  This did not happen with previous cycle.  Has noticed that she has some new pins and needles sensation.    Otherwise, denies new bone pain, fevers, chills, night sweats, lumps/bumps, tongue swelling, shortness of breath, syncope, lightheadedness, constipation, nausea or vomiting, very easy bruising or bleeding, or urinary changes.         REVIEW OF SYSTEMS:    10-systems otherwise reviewed and negative except as per Interval History.    PAST MEDICAL HISTORY:    Past Medical History:   Diagnosis Date   ??? Arthritis    ??? High cholesterol    ??? Hypertension    ??? Multiple myeloma (CMS-HCC) 01/18/2014         PAST SURGICAL HISTORY:    Past Surgical History:   Procedure Laterality Date   ??? negative surgical history     ??? PR AMPUTATION TOE,MT-P JT Right 10/24/2017    Procedure: AMPUTATION, TOE; METATARSOPHALANGEAL JOINT;  Surgeon: Boykin Reaper, MD;  Location: MAIN OR [facility];  Service: Vascular         ALLERGIES: No Known Allergies    MEDICATIONS:    Current Outpatient Medications   Medication Sig Dispense Refill   ??? acetaminophen (TYLENOL) 325 MG tablet Take 2 tablets (650 mg total) by mouth every six (6) hours as needed. 30 tablet 0   ??? alendronate (FOSAMAX) 70 MG tablet Take 1 tablet by mouth once a week. Takes on Tuesday AM     ??? amLODIPine (NORVASC) 5 MG tablet Take 5 mg by mouth daily.      ??? clopidogrel (PLAVIX) 75 mg tablet Take 1 tablet (75 mg total) by mouth daily. 90 tablet 30   ??? colchicine (COLCRYS) 0.6 mg tablet Take 1 tablet (0.6 mg total) by mouth Every Monday, Wednesday, and Friday. 12 tablet 3   ??? ixazomib (NINLARO) 3 mg capsule Take 1 capsule by mouth daily on days 1, 8, and 15. Take at least 1 hour before or at least 1 hours after food. 3 capsule 2   ??? magnesium oxide (MAG-OX) 400 mg (241.3 mg magnesium) tablet Take 1 tablet (400 mg total) by mouth Two (2) times a day. 120 tablet 2   ??? melphalan (ALKERAN) 2 mg tablet Take 5 tabs (10 mg) by mouth daily on days 1-4 of a 28-day cycle. 20 tablet 2   ??? metoprolol tartrate (LOPRESSOR) 25 MG tablet Take 0.5 tablets (12.5 mg total) by mouth Two (2) times a day. 30 tablet 0   ??? mirtazapine (REMERON) 45 MG tablet Take 1 tablet (45 mg total) by mouth nightly. 30 tablet 6   ???  ondansetron (ZOFRAN-ODT) 4 MG disintegrating tablet Take 1 tablet (4 mg total) by mouth every eight (8) hours as needed for nausea. 60 tablet 3   ??? oxyCODONE (ROXICODONE) 5 MG immediate release tablet      ??? polyethylene glycol (MIRALAX) 17 gram packet Take 17 g by mouth daily as needed. 10 packet 0   ??? potassium chloride (KLOR-CON) 10 MEQ CR tablet Take 2 tablets (20 mEq total) by mouth daily. 30 tablet 11   ??? predniSONE (DELTASONE) 50 MG tablet Take 2 tablets (100 mg) by mouth daily on days 1-4 of a 28-day cycle 8 tablet 2   ??? senna (SENOKOT) 8.6 mg tablet Take 1 tablet by mouth nightly as needed for constipation. 10 tablet 0   ??? silver sulfaDIAZINE (SILVADENE) 1 % cream Apply topically daily. 100 g 2 ??? sodium hypochlorite (DAKIN'S, HALF-STRENGTH,) external solution Apply topically daily. Use as a wound cleanser, pat dry, then apply dressing 473 mL 1   ??? traMADol (ULTRAM) 50 mg tablet Take 0.5 tablets (25 mg total) by mouth every eight (8) hours as needed for pain. 60 tablet 0   ??? valACYclovir (VALTREX) 500 MG tablet Take 1 tablet (500 mg total) by mouth daily. 30 tablet 6   ??? zinc gluconate 100 mg Tab Take 100 mg by mouth daily at 0600. 30 each 11   ??? dexamethasone (DECADRON) 4 MG tablet Take 2 tablets (8 mg total) by mouth 2 (two) times a day with meals. (Patient not taking: Reported on 02/24/2018) 120 tablet 0     No current facility-administered medications for this visit.          PHYSICAL EXAMINATION:  VITALS:   Vitals:    02/24/18 0930   BP: 114/66   Pulse: 88   Resp: 15   Temp: 36.7 ??C (98 ??F)   TempSrc: Oral   SpO2: 98%   Weight: 60.3 kg (133 lb)   Height: 170.2 cm (5' 7.01)       ECOG PERFORMANCE STATUS: 1    GENERAL:No acute distress. She is ambulatory without assistive devices today.  She is accompanied by her daughter, Misty Stanley.  HEENT: Anicteric sclera. No conjunctival pallor. No oropharyngeal exudates, erythema, nor ulceration.  Dentures are poorly fitted and affect her speech.  LYMPH: No cervical, supraclavicular, nor axillary adenopathy.  CV: S1,S2, no murmurs, rubs, nor gallops.    LUNGS: Speaking comfortably in full sentences without increased work of breathing. Clear to auscultation bilaterally.  ABDOMEN: Soft, non-tender, and non-distended. Normoactive bowel sounds. No hepatosplenomegaly.  EXTREMITIES: No edema.    LABS/IMAGING/OTHER TESTS:  Hospital Outpatient Visit on 02/24/2018   Component Date Value Ref Range Status   ??? Platelet 02/24/2018 69* 150 - 440 10*9/L Final   ??? Crossmatch 02/25/2018 Compatible   Final   ??? Unit Blood Type 02/25/2018 O Pos   Final   ??? ISBT Number 02/25/2018 5100   Final   ??? Unit # 02/25/2018 [id number]   Final   ??? Status 02/25/2018 Transfused   Final   ??? Product ID 02/25/2018 Red Blood Cells   Final   ??? PRODUCT CODE 02/25/2018 [id number]   Final   ??? Crossmatch 02/25/2018 Compatible   Final   ??? Unit Blood Type 02/25/2018 O Pos   Final   ??? ISBT Number 02/25/2018 5100   Final   ??? Unit # 02/25/2018 [id number]   Final   ??? Status 02/25/2018 Transfused   Final   ??? Product ID 02/25/2018 Red Blood Cells  Final   ??? PRODUCT CODE 02/25/2018 [id number]   Final   ??? Unit Blood Type 02/25/2018 A Pos   Final   ??? ISBT Number 02/25/2018 6200   Final   ??? Unit # 02/25/2018 [id number]   Final   ??? Status 02/25/2018 Transfused   Final   ??? Product ID 02/25/2018 Platelets   Final   ??? PRODUCT CODE 02/25/2018 [id number]   Final   Lab on 02/24/2018   Component Date Value Ref Range Status   ??? Antibody Screen 02/24/2018 NEG   Final   ??? ABO Grouping 02/24/2018 O POS   Final   ??? Magnesium 02/24/2018 2.2  1.6 - 2.2 mg/dL Final   ??? Kappa Free, Serum 02/24/2018 69.59* 0.33 - 1.94 mg/dL Final    Effective November 15th, 2018 a new analyzer for serum free light chain testing was implemented. Please consider the potential need for re-baselining and subsequent monitoring of patients. Further information regarding serum free light chain testing can be found in [facility] memorandum Core #148.   ??? Lambda Free, Serum 02/24/2018 <0.25* 0.57 - 2.63 mg/dL Final    Effective November 15th, 2018 a new analyzer for serum free light chain testing was implemented. Please consider the potential need for re-baselining and subsequent monitoring of patients. Further information regarding serum free light chain testing can be found in [facility] memorandum Core #148.   ??? K/L FLC Ratio 02/24/2018   0.26 - 1.65 Final    Unable to calculate the K/L Ratio due to a free light chain concentration result outside of the clinical reportable range.   ??? Sodium 02/24/2018 136  135 - 145 mmol/L Final   ??? Potassium 02/24/2018 5.2* 3.5 - 5.0 mmol/L Final   ??? Chloride 02/24/2018 106  98 - 107 mmol/L Final ??? CO2 02/24/2018 23.0  22.0 - 30.0 mmol/L Final   ??? BUN 02/24/2018 24* 7 - 21 mg/dL Final   ??? Creatinine 02/24/2018 1.57* 0.60 - 1.00 mg/dL Final   ??? BUN/Creatinine Ratio 02/24/2018 15   Final   ??? EGFR MDRD Non Af Amer 02/24/2018 32* >=60 mL/min/1.63m2 Final   ??? EGFR MDRD Af Amer 02/24/2018 39* >=60 mL/min/1.30m2 Final   ??? Anion Gap 02/24/2018 7* 9 - 15 mmol/L Final   ??? Glucose 02/24/2018 102  65 - 179 mg/dL Final   ??? Calcium 65/78/4696 8.4* 8.5 - 10.2 mg/dL Final   ??? Albumin 29/52/8413 3.1* 3.5 - 5.0 g/dL Final   ??? Total Protein 02/24/2018 7.7  6.5 - 8.3 g/dL Final   ??? Total Bilirubin 02/24/2018 0.9  0.0 - 1.2 mg/dL Final   ??? AST 24/40/1027 13* 14 - 38 U/L Final   ??? ALT 02/24/2018 23  15 - 48 U/L Final   ??? Alkaline Phosphatase 02/24/2018 76  38 - 126 U/L Final   ??? ALBUMIN (SPE) 02/24/2018 2.8* 3.5 - 5.0 g/dL Final   ??? ALPHA-1 GLOBULIN 02/24/2018 0.6* 0.2 - 0.5 g/dL Final   ??? ALPHA-2 GLOBULIN 02/24/2018 1.0  0.5 - 1.1 g/dL Final   ??? BETA-1 GLOBULIN 02/24/2018 0.1* 0.3 - 0.6 g/dL Final   ??? BETA-2 GLOBULIN 02/24/2018 0.2  0.2 - 0.6 g/dL Final   ??? GAMMAGLOBULIN 02/24/2018 3.0* 0.5 - 1.5 g/dL Final   ??? M Spike 02/24/2018 2.8* Not Present g/dL Final   ??? SPE Interpretation 02/24/2018    Final    The SPE pattern demonstrates an M-Spike in the gamma region.  See Immunofixation report.     ???  Immunofixation Electrophoresis, Se* 02/24/2018    Final    Monoclonal component typed as free Kappa light chains. Concentration of monoclonal protein is too low to accurately quantify.      Monoclonal component typed as IgG Kappa (M SPIKE).     ??? Total Protein 02/24/2018 7.7  6.5 - 8.3 g/dL Final   ??? Total Protein 02/24/2018 7.7  6.5 - 8.3 g/dL Final   ??? Total IgG 16/07/9603 3,452* 600-1,700 mg/dL Final   ??? IgM 54/06/8118 <25* 35 - 290 mg/dL Final   ??? IgA 14/78/2956 8.8* 40.0 - 400.0 mg/dL Final   ??? WBC 21/30/8657 0.1* 4.5 - 11.0 10*9/L Final   ??? RBC 02/24/2018 2.19* 4.00 - 5.20 10*12/L Final   ??? HGB 02/24/2018 8.0* 12.0 - 16.0 g/dL Final   ??? HCT 84/69/6295 23.4* 36.0 - 46.0 % Final   ??? MCV 02/24/2018 106.6* 80.0 - 100.0 fL Final   ??? MCH 02/24/2018 36.4* 26.0 - 34.0 pg Final   ??? MCHC 02/24/2018 34.1  31.0 - 37.0 g/dL Final   ??? RDW 28/41/3244 20.7* 12.0 - 15.0 % Final   ??? MPV 02/24/2018 9.0  7.0 - 10.0 fL Final   ??? Platelet 02/24/2018 6* 150 - 440 10*9/L Final   ??? Neutrophils % 02/24/2018 28.0  % Final   ??? Lymphocytes % 02/24/2018 56.0  % Final   ??? Monocytes % 02/24/2018 6.0  % Final   ??? Eosinophils % 02/24/2018 1.0  % Final   ??? Basophils % 02/24/2018 1.0  % Final   ??? Absolute Neutrophils 02/24/2018 0.0* 2.0 - 7.5 10*9/L Final   ??? Absolute Lymphocytes 02/24/2018 0.1* 1.5 - 5.0 10*9/L Final   ??? Absolute Monocytes 02/24/2018 0.0* 0.2 - 0.8 10*9/L Final   ??? Absolute Eosinophils 02/24/2018 0.0  0.0 - 0.4 10*9/L Final   ??? Absolute Basophils 02/24/2018 0.0  0.0 - 0.1 10*9/L Final   ??? Large Unstained Cells 02/24/2018 8* 0 - 4 % Final   ??? Macrocytosis 02/24/2018 Marked* Not Present Final   ??? Anisocytosis 02/24/2018 Moderate* Not Present Final   ??? Smear Review Comments 02/24/2018 See Comment* Undefined Final    Myelocytes present-rare.     ??? Rouleaux 02/24/2018 Present* Not Present Final

## 2018-02-24 NOTE — Unmapped (Signed)
Lab on 02/24/2018   Component Date Value Ref Range Status   ??? Antibody Screen 02/24/2018 NEG   Final   ??? ABO Grouping 02/24/2018 O POS   Final   ??? Magnesium 02/24/2018 2.2  1.6 - 2.2 mg/dL Final   ??? Kappa Free, Serum 02/24/2018 69.59* 0.33 - 1.94 mg/dL Final    Effective November 15th, 2018 a new analyzer for serum free light chain testing was implemented. Please consider the potential need for re-baselining and subsequent monitoring of patients. Further information regarding serum free light chain testing can be found in [facility] memorandum Core #148.   ??? Lambda Free, Serum 02/24/2018 <0.25* 0.57 - 2.63 mg/dL Final    Effective November 15th, 2018 a new analyzer for serum free light chain testing was implemented. Please consider the potential need for re-baselining and subsequent monitoring of patients. Further information regarding serum free light chain testing can be found in [facility] memorandum Core #148.   ??? K/L FLC Ratio 02/24/2018   0.26 - 1.65 Final    Unable to calculate the K/L Ratio due to a free light chain concentration result outside of the clinical reportable range.   ??? Sodium 02/24/2018 136  135 - 145 mmol/L Final   ??? Potassium 02/24/2018 5.2* 3.5 - 5.0 mmol/L Final   ??? Chloride 02/24/2018 106  98 - 107 mmol/L Final   ??? CO2 02/24/2018 23.0  22.0 - 30.0 mmol/L Final   ??? BUN 02/24/2018 24* 7 - 21 mg/dL Final   ??? Creatinine 02/24/2018 1.57* 0.60 - 1.00 mg/dL Final   ??? BUN/Creatinine Ratio 02/24/2018 15   Final   ??? EGFR MDRD Non Af Amer 02/24/2018 32* >=60 mL/min/1.60m2 Final   ??? EGFR MDRD Af Amer 02/24/2018 39* >=60 mL/min/1.76m2 Final   ??? Anion Gap 02/24/2018 7* 9 - 15 mmol/L Final   ??? Glucose 02/24/2018 102  65 - 179 mg/dL Final   ??? Calcium 16/07/9603 8.4* 8.5 - 10.2 mg/dL Final   ??? Albumin 54/06/8118 3.1* 3.5 - 5.0 g/dL Final   ??? Total Protein 02/24/2018 7.7  6.5 - 8.3 g/dL Final   ??? Total Bilirubin 02/24/2018 0.9  0.0 - 1.2 mg/dL Final   ??? AST 14/78/2956 13* 14 - 38 U/L Final   ??? ALT 02/24/2018 23  15 - 48 U/L Final   ??? Alkaline Phosphatase 02/24/2018 76  38 - 126 U/L Final   ??? Total Protein 02/24/2018 7.7  6.5 - 8.3 g/dL Final   ??? Total IgG 21/30/8657 3,452* 600-1,700 mg/dL Final   ??? IgM 84/69/6295 <25* 35 - 290 mg/dL Final   ??? IgA 28/41/3244 8.8* 40.0 - 400.0 mg/dL Final   ??? WBC 10/22/7251 0.1* 4.5 - 11.0 10*9/L Final   ??? RBC 02/24/2018 2.19* 4.00 - 5.20 10*12/L Final   ??? HGB 02/24/2018 8.0* 12.0 - 16.0 g/dL Final   ??? HCT 66/44/0347 23.4* 36.0 - 46.0 % Final   ??? MCV 02/24/2018 106.6* 80.0 - 100.0 fL Final   ??? MCH 02/24/2018 36.4* 26.0 - 34.0 pg Final   ??? MCHC 02/24/2018 34.1  31.0 - 37.0 g/dL Final   ??? RDW 42/59/5638 20.7* 12.0 - 15.0 % Final   ??? MPV 02/24/2018 9.0  7.0 - 10.0 fL Final   ??? Platelet 02/24/2018 6* 150 - 440 10*9/L Final   ??? Neutrophils % 02/24/2018 28.0  % Final   ??? Lymphocytes % 02/24/2018 56.0  % Final   ??? Monocytes % 02/24/2018 6.0  %  Final   ??? Eosinophils % 02/24/2018 1.0  % Final   ??? Basophils % 02/24/2018 1.0  % Final   ??? Absolute Neutrophils 02/24/2018 0.0* 2.0 - 7.5 10*9/L Final   ??? Absolute Lymphocytes 02/24/2018 0.1* 1.5 - 5.0 10*9/L Final   ??? Absolute Monocytes 02/24/2018 0.0* 0.2 - 0.8 10*9/L Final   ??? Absolute Eosinophils 02/24/2018 0.0  0.0 - 0.4 10*9/L Final   ??? Absolute Basophils 02/24/2018 0.0  0.0 - 0.1 10*9/L Final   ??? Large Unstained Cells 02/24/2018 8* 0 - 4 % Final   ??? Macrocytosis 02/24/2018 Marked* Not Present Final   ??? Anisocytosis 02/24/2018 Moderate* Not Present Final   ??? Smear Review Comments 02/24/2018 See Comment* Undefined Final    Myelocytes present-rare.     ??? Rouleaux 02/24/2018 Present* Not Present Final   Hospital Outpatient Visit on 02/24/2018   Component Date Value Ref Range Status   ??? Platelet 02/24/2018 69* 150 - 440 10*9/L Final   ??? Crossmatch 02/24/2018 Compatible   Final   ??? Unit Blood Type 02/24/2018 O Pos   Final   ??? ISBT Number 02/24/2018 5100   Final   ??? Unit # 02/24/2018 [id number]   Final   ??? Status 02/24/2018 Issued   Final   ??? Spec Expiration 02/24/2018 [id number]   Final   ??? Product ID 02/24/2018 Red Blood Cells   Final   ??? PRODUCT CODE 02/24/2018 [id number]   Final   ??? Crossmatch 02/24/2018 Compatible   Final   ??? Unit Blood Type 02/24/2018 O Pos   Final   ??? ISBT Number 02/24/2018 5100   Final   ??? Unit # 02/24/2018 [id number]   Final   ??? Status 02/24/2018 Issued   Final   ??? Spec Expiration 02/24/2018 [id number]   Final   ??? Product ID 02/24/2018 Red Blood Cells   Final   ??? PRODUCT CODE 02/24/2018 [id number]   Final   ??? Unit Blood Type 02/24/2018 A Pos   Final   ??? ISBT Number 02/24/2018 6200   Final   ??? Unit # 02/24/2018 [id number]   Final   ??? Status 02/24/2018 Issued   Final   ??? Spec Expiration 02/24/2018 [id number]   Final   ??? Product ID 02/24/2018 Platelets   Final   ??? PRODUCT CODE 02/24/2018 [id number]   Final

## 2018-02-24 NOTE — Unmapped (Addendum)
Glad to see you in clinic today!  Some good news with your lab work is that your total protein and your IgG are trending down.  Dr. Anise Salvo is concerned about your weakness because this is how you presented with you myeloma when you were diagnosed, though you did have hypercalcemia (too much calcium in your blood).  We want to keep a close eye on this.  I will also put in a new referral to physical therapy.    We know that the melphalan has brought all of your counts down, and you're getting blood and platelets today.  Our concern, especially combined with this new weakness, is falls.  It you hit your head, it could be devastating.  Please do not take any ibuprofen, aspirin, aleve, or any other non-steroidal anti-inflammatory.  If you do fall and hit your head, you must go to the Emergency Room and they will do a scan of your head.  I'm going to restart your antibiotics until your neutrophil count comes back up.  Do not take your ixazomib on Friday.    We'll check your labs Monday and Thursday of next week and then see you on Monday May 20th with both myself and Dr. Anise Salvo.    If you have any questions, please do not hesitate to contact us.    Please contact us for any of these symptoms:    1. New bone pain  2. Unintentional weight loss  3. Unexplained fatigue  4. Swelling of your legs  5. Yellowing of the skin or eyes    When reviewing your results, please remember that the results of many of the tests we order can vary somewhat and that variation often means nothing.  Sometimes when we get results back after your clinic visit, if it looks like there???s some variation of that type, we may decide to recheck things sooner than we discussed in clinic.  If you get a call that we want to recheck things sooner, do not panic. It does not mean that things are going wrong.    For appointments & questions Monday through Friday 8 AM??? 5 PM   please call [phone] or Toll free [phone].    On Nights, Weekends and Holidays Call [phone] and ask for the adult hematologist/oncologist on call.    [name], AGPCNP-C, MSN, OCN  Nurse Practitioner  Hematologic Malignancies  [facility]    Nurse Navigator: Layla Maw, RN  Questions and appointments M-F 8am - 5pm: [phone] or [phone]    [facility]  [address]  [place], [place] [address]  [email or web address]    Results for orders placed or performed in visit on 02/24/18   Magnesium Level   Result Value Ref Range    Magnesium 2.2 1.6 - 2.2 mg/dL   Comprehensive Metabolic Panel   Result Value Ref Range    Sodium 136 135 - 145 mmol/L    Potassium 5.2 (H) 3.5 - 5.0 mmol/L    Chloride 106 98 - 107 mmol/L    CO2 23.0 22.0 - 30.0 mmol/L    BUN 24 (H) 7 - 21 mg/dL    Creatinine 3.66 (H) 0.60 - 1.00 mg/dL    BUN/Creatinine Ratio 15     EGFR MDRD Non Af Amer 32 (L) >=60 mL/min/1.75m2    EGFR MDRD Af Amer 39 (L) >=60 mL/min/1.57m2    Anion Gap 7 (L) 9 - 15 mmol/L    Glucose 102 65 - 179 mg/dL  Calcium 8.4 (L) 8.5 - 10.2 mg/dL    Albumin 3.1 (L) 3.5 - 5.0 g/dL    Total Protein 7.7 6.5 - 8.3 g/dL    Total Bilirubin 0.9 0.0 - 1.2 mg/dL    AST 13 (L) 14 - 38 U/L    ALT 23 15 - 48 U/L    Alkaline Phosphatase 76 38 - 126 U/L   Type and Screen   Result Value Ref Range    Antibody Screen NEG     ABO Grouping O POS    Myeloma Workup Chemistries   Result Value Ref Range    Total Protein 7.7 6.5 - 8.3 g/dL    Total IgG 3,299 (H) 600-1,700 mg/dL    IgM <24 (L) 35 - 268 mg/dL    IgA 8.8 (L) 34.1 - 962.2 mg/dL   CBC w/ Differential   Result Value Ref Range    WBC 0.1 (LL) 4.5 - 11.0 10*9/L    RBC 2.19 (L) 4.00 - 5.20 10*12/L    HGB 8.0 (L) 12.0 - 16.0 g/dL    HCT 29.7 (L) 98.9 - 46.0 %    MCV 106.6 (H) 80.0 - 100.0 fL    MCH 36.4 (H) 26.0 - 34.0 pg    MCHC 34.1 31.0 - 37.0 g/dL    RDW 21.1 (H) 94.1 - 15.0 %    MPV 9.0 7.0 - 10.0 fL    Platelet 6 (LL) 150 - 440 10*9/L    Neutrophils % 28.0 %    Lymphocytes % 56.0 %    Monocytes % 6.0 %    Eosinophils % 1.0 %    Basophils % 1.0 % Absolute Neutrophils 0.0 (LL) 2.0 - 7.5 10*9/L    Absolute Lymphocytes 0.1 (L) 1.5 - 5.0 10*9/L    Absolute Monocytes 0.0 (L) 0.2 - 0.8 10*9/L    Absolute Eosinophils 0.0 0.0 - 0.4 10*9/L    Absolute Basophils 0.0 0.0 - 0.1 10*9/L    Large Unstained Cells 8 (H) 0 - 4 %    Macrocytosis Marked (A) Not Present    Anisocytosis Moderate (A) Not Present   Morphology Review   Result Value Ref Range    Smear Review Comments See Comment (A) Undefined    [place] Present (A) Not Present

## 2018-02-25 LAB — MYELOMA WORKUP, SERUM
ALBUMIN (SPE): 2.8 g/dL — ABNORMAL LOW (ref 3.5–5.0)
ALPHA-2 GLOBULIN: 1 g/dL (ref 0.5–1.1)
BETA-2 GLOBULIN: 0.2 g/dL (ref 0.2–0.6)
GAMMAGLOBULIN: 3 g/dL — ABNORMAL HIGH (ref 0.5–1.5)
PROTEIN TOTAL: 7.7 g/dL (ref 6.5–8.3)

## 2018-02-25 LAB — SPE INTERPRETATION: Lab: 0

## 2018-03-01 ENCOUNTER — Ambulatory Visit: Admit: 2018-03-01 | Discharge: 2018-03-01 | Payer: MEDICARE

## 2018-03-01 DIAGNOSIS — C9002 Multiple myeloma in relapse: Principal | ICD-10-CM

## 2018-03-01 LAB — SMEAR REVIEW

## 2018-03-01 LAB — CBC W/ AUTO DIFF
BASOPHILS ABSOLUTE COUNT: 0 10*9/L (ref 0.0–0.1)
BASOPHILS RELATIVE PERCENT: 0.1 %
EOSINOPHILS ABSOLUTE COUNT: 0 10*9/L (ref 0.0–0.4)
HEMATOCRIT: 29.5 % — ABNORMAL LOW (ref 36.0–46.0)
HEMOGLOBIN: 9.8 g/dL — ABNORMAL LOW (ref 13.5–16.0)
LARGE UNSTAINED CELLS: 3 % (ref 0–4)
LYMPHOCYTES ABSOLUTE COUNT: 0.1 10*9/L — ABNORMAL LOW (ref 1.5–5.0)
MEAN CORPUSCULAR HEMOGLOBIN CONC: 33 g/dL (ref 31.0–37.0)
MEAN CORPUSCULAR HEMOGLOBIN: 33.8 pg (ref 26.0–34.0)
MEAN CORPUSCULAR VOLUME: 102.3 fL — ABNORMAL HIGH (ref 80.0–100.0)
MEAN PLATELET VOLUME: 9 fL (ref 7.0–10.0)
MONOCYTES ABSOLUTE COUNT: 0.1 10*9/L — ABNORMAL LOW (ref 0.2–0.8)
MONOCYTES RELATIVE PERCENT: 4.9 %
NEUTROPHILS ABSOLUTE COUNT: 1.6 10*9/L — ABNORMAL LOW (ref 2.0–7.5)
NEUTROPHILS RELATIVE PERCENT: 88.5 %
PLATELET COUNT: 14 10*9/L — ABNORMAL LOW (ref 150–440)
RED BLOOD CELL COUNT: 2.88 10*12/L — ABNORMAL LOW (ref 4.00–5.20)
RED CELL DISTRIBUTION WIDTH: 22.3 % — ABNORMAL HIGH (ref 12.0–15.0)
WBC ADJUSTED: 1.8 10*9/L — ABNORMAL LOW (ref 4.5–11.0)

## 2018-03-01 LAB — SLIDE REVIEW

## 2018-03-01 LAB — MEAN CORPUSCULAR VOLUME: Lab: 102.3 — ABNORMAL HIGH

## 2018-03-01 NOTE — Unmapped (Signed)
VS taken per protocol. Labs drawn       Pt tolerated well.    Results reviewed - Platelets were 14, provider notified. Several attempts by 3 RN's to place PIV. Kayla Torres Nurse navigater called and recheduled pt for main campus appt tomorrow. Pt was told to hydrate today.    Interventions - no intervention needed.       AVS Patient refused.    Pt stable and ambulated independently from clinic.

## 2018-03-01 NOTE — Unmapped (Signed)
[facility] infusion was unable to obtain IV access for platelett transfusion. I called infusion here and they will try to transfuse tomorrow. I called Stacy RN at [facility] to inform her and to have her relay the plan to the patient.

## 2018-03-02 ENCOUNTER — Encounter: Admit: 2018-03-02 | Discharge: 2018-03-03 | Payer: MEDICARE

## 2018-03-02 DIAGNOSIS — C9002 Multiple myeloma in relapse: Principal | ICD-10-CM

## 2018-03-02 LAB — CBC W/ AUTO DIFF
BASOPHILS ABSOLUTE COUNT: 0 10*9/L (ref 0.0–0.1)
BASOPHILS RELATIVE PERCENT: 0.2 %
EOSINOPHILS ABSOLUTE COUNT: 0 10*9/L (ref 0.0–0.4)
HEMATOCRIT: 30.2 % — ABNORMAL LOW (ref 36.0–46.0)
HEMOGLOBIN: 10.1 g/dL — ABNORMAL LOW (ref 12.0–16.0)
LARGE UNSTAINED CELLS: 3 % (ref 0–4)
LYMPHOCYTES RELATIVE PERCENT: 3.9 %
MEAN CORPUSCULAR HEMOGLOBIN CONC: 33.5 g/dL (ref 31.0–37.0)
MEAN CORPUSCULAR HEMOGLOBIN: 34.3 pg — ABNORMAL HIGH (ref 26.0–34.0)
MEAN CORPUSCULAR VOLUME: 102.3 fL — ABNORMAL HIGH (ref 80.0–100.0)
MONOCYTES ABSOLUTE COUNT: 0.3 10*9/L (ref 0.2–0.8)
MONOCYTES RELATIVE PERCENT: 8.4 %
NEUTROPHILS ABSOLUTE COUNT: 3.2 10*9/L (ref 2.0–7.5)
NEUTROPHILS RELATIVE PERCENT: 84.7 %
PLATELET COUNT: 20 10*9/L — ABNORMAL LOW (ref 150–440)
RED BLOOD CELL COUNT: 2.96 10*12/L — ABNORMAL LOW (ref 4.00–5.20)
RED CELL DISTRIBUTION WIDTH: 21.3 % — ABNORMAL HIGH (ref 12.0–15.0)
WBC ADJUSTED: 3.8 10*9/L — ABNORMAL LOW (ref 4.5–11.0)

## 2018-03-02 LAB — SLIDE REVIEW

## 2018-03-02 LAB — BASOPHILS RELATIVE PERCENT: Lab: 0.2

## 2018-03-02 LAB — TOXIC GRANULATION

## 2018-03-02 NOTE — Unmapped (Signed)
If you feel like this is an emergency please call 911.  For appointments or questions Monday through Friday 8AM-5PM please call [phone] or Toll Free [phone]. For Medical questions or concerns ask for the Nurse Triage Line.  On Nights, Weekends, and Holidays call [phone] and ask for the Oncologist on Call.  Reasons to call the Nurse Triage Line:  Fever of 100.5 or greater  Nausea and/or vomiting not relived with nausea medicine  Diarrhea or constipation  Severe pain not relieved with usual pain regimen  Shortness of breath  Uncontrolled bleeding  Mental status changes    Lab on 03/02/2018   Component Date Value Ref Range Status   ??? WBC 03/02/2018 3.8* 4.5 - 11.0 10*9/L Final   ??? RBC 03/02/2018 2.96* 4.00 - 5.20 10*12/L Final   ??? HGB 03/02/2018 10.1* 12.0 - 16.0 g/dL Final   ??? HCT 36/64/4034 30.2* 36.0 - 46.0 % Final   ??? MCV 03/02/2018 102.3* 80.0 - 100.0 fL Final   ??? MCH 03/02/2018 34.3* 26.0 - 34.0 pg Final   ??? MCHC 03/02/2018 33.5  31.0 - 37.0 g/dL Final   ??? RDW 74/25/9563 21.3* 12.0 - 15.0 % Final   ??? MPV 03/02/2018 10.0  7.0 - 10.0 fL Final   ??? Platelet 03/02/2018 20* 150 - 440 10*9/L Final   ??? Neutrophils % 03/02/2018 84.7  % Final   ??? Lymphocytes % 03/02/2018 3.9  % Final   ??? Monocytes % 03/02/2018 8.4  % Final   ??? Eosinophils % 03/02/2018 0.1  % Final   ??? Basophils % 03/02/2018 0.2  % Final   ??? Neutrophil Left Shift 03/02/2018 3+* Not Present Final   ??? Absolute Neutrophils 03/02/2018 3.2  2.0 - 7.5 10*9/L Final   ??? Absolute Lymphocytes 03/02/2018 0.2* 1.5 - 5.0 10*9/L Final   ??? Absolute Monocytes 03/02/2018 0.3  0.2 - 0.8 10*9/L Final   ??? Absolute Eosinophils 03/02/2018 0.0  0.0 - 0.4 10*9/L Final   ??? Absolute Basophils 03/02/2018 0.0  0.0 - 0.1 10*9/L Final   ??? Large Unstained Cells 03/02/2018 3  0 - 4 % Final   ??? Macrocytosis 03/02/2018 Marked* Not Present Final   ??? Anisocytosis 03/02/2018 Moderate* Not Present Final   ??? Smear Review Comments 03/02/2018 See Comment* Undefined Final    Slide reviewed     ??? Dohle Bodies 03/02/2018 Present* Not Present Final   ??? Toxic Granulation 03/02/2018 Present* Not Present Final

## 2018-03-02 NOTE — Unmapped (Signed)
1253:  Labs drawn and sent for analysis.  Care provided by  Docia Furl, RN

## 2018-03-02 NOTE — Unmapped (Signed)
Labs did not meet requirements for transfusion, patient asymptomatic. Discharged ambulatory with AVS.

## 2018-03-03 ENCOUNTER — Ambulatory Visit: Admit: 2018-03-03 | Discharge: 2018-03-03 | Payer: MEDICARE | Attending: Vascular Surgery | Primary: Vascular Surgery

## 2018-03-03 ENCOUNTER — Ambulatory Visit: Admit: 2018-03-03 | Discharge: 2018-03-03 | Payer: MEDICARE

## 2018-03-03 DIAGNOSIS — I739 Peripheral vascular disease, unspecified: Principal | ICD-10-CM

## 2018-03-05 NOTE — Unmapped (Signed)
ASSESSMENT 77 y.o. female with PAD. Surveillance duplex is favorable today. Leg symptoms are not related to her vascular disease.    PLAN:  1. F/U 6 months with duplex for stent surveillance    HISTORY OF PRESENT ILLNESS: Kayla Torres is a 77 y.o. 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 She did well after surgery and was able to be discharged from PT since she quickly regained ability to walk independently. However, over the last 2 weeks she has had severe heaviness in both legs. She is having trouble walking and needs a family member to lift her legs one at a time in order to climb up steps. This was sudden onset. She does not have leg pain and both legs are equally week.     ROS: The balance of 10 systems reviewed is negative except as noted in the HPI     RX/ ALLERGIES/ MED HX/SURG HX/ SOC HX/FAM HX: Reviewed & updated in Epic    Physical Exam:   Vitals:    03/03/18 1054   BP: 137/73   Pulse: 90   Temp: 36.9 ??C (98.5 ??F)     General: well appearing, no acute distress, sitting in wheelchair  Head: normocephalic, atraumatic, tongue midline  Cardiac: RRR   Lungs: Easy work of breathing, CTA Bilaterally  MSK: negative joint tenderness, normal ROM of toes and ankles  Skin: intact, good turgor  Neuro: AAO x 3, appropriate to questions  Extremities: Upper extremities warm and perfused. Bilateral lower extremities negative for edema, ulceration or digital gangrene. Right lateral foot well healed.   Pulses: Bilateral radial pulses palpable. Biphasic pedal signals.      DIAGNOSTICS:   Images and reports reviewed independently. Patent R SFA stent.

## 2018-03-05 NOTE — Unmapped (Signed)
Hi,     Nurse Enid Derry with [facility] contacted the Communication Center regarding the following:    Patient is requesting hemoglobin numbers from recent lab results.    Additionally Nurse Enid Derry has questions about patient's active treatment.    Please feel free to contact Nurse Starlena at [phone].    Thanks in advance,    Kelli Hope  [facility]   [phone]

## 2018-03-05 NOTE — Unmapped (Signed)
[facility] Refill Coordination Note  Specialty Medication(s): MELPHALAN 2MG , Ninlaro 3mg   Additional Medications shipped: prednisone 50mg     Kayla Torres, DOB: May 06, 1941  Phone: [phone] (home) , Alternate phone contact: N/A  Phone or address changes today?: No  All above HIPAA information was verified with patient.  Shipping Address: [address]  [place] [address]   Insurance changes? No    Completed refill call assessment today to schedule patient's medication shipment from the [facility] [phone]).      Confirmed the medication and dosage are correct and have not changed: Yes, regimen is correct and unchanged.    Confirmed patient started or stopped the following medications in the past month:  No, there are no changes reported at this time.    Are you tolerating your medication?:  Pablo reports tolerating the medication.    ADHERENCE    (Below is required for Medicare Part B or Transplant patients only - per drug):   How many tablets were dispensed last month: Ninlaro: 3 tabs  Patient currently has 0 remaining.    Did you miss any doses in the past 4 weeks? No missed doses reported.    FINANCIAL/SHIPPING    Delivery Scheduled: Yes, Expected medication delivery date: 03/10/2018     The patient will receive an FSI print out for each medication shipped and additional FDA Medication Guides as required.  Patient education from [place] or Robet Leu may also be included in the shipment    Desarai did not have any additional questions at this time.    Delivery address validated in FSI scheduling system: Yes, address listed in FSI is correct.    We will follow up with patient monthly for standard refill processing and delivery.      Thank you,  Breck Coons [facility] Specialty Pharmacist

## 2018-03-08 ENCOUNTER — Encounter: Admit: 2018-03-08 | Discharge: 2018-03-08 | Payer: MEDICARE | Attending: Adult Health | Primary: Adult Health

## 2018-03-08 ENCOUNTER — Encounter: Admit: 2018-03-08 | Discharge: 2018-03-08 | Payer: MEDICARE

## 2018-03-08 DIAGNOSIS — N183 Chronic kidney disease, stage 3 (moderate): Secondary | ICD-10-CM

## 2018-03-08 DIAGNOSIS — R5383 Other fatigue: Secondary | ICD-10-CM

## 2018-03-08 DIAGNOSIS — T451X5A Adverse effect of antineoplastic and immunosuppressive drugs, initial encounter: Secondary | ICD-10-CM

## 2018-03-08 DIAGNOSIS — D61818 Other pancytopenia: Secondary | ICD-10-CM

## 2018-03-08 DIAGNOSIS — Z79899 Other long term (current) drug therapy: Secondary | ICD-10-CM

## 2018-03-08 DIAGNOSIS — D61810 Antineoplastic chemotherapy induced pancytopenia: Secondary | ICD-10-CM

## 2018-03-08 DIAGNOSIS — C9002 Multiple myeloma in relapse: Principal | ICD-10-CM

## 2018-03-08 DIAGNOSIS — D801 Nonfamilial hypogammaglobulinemia: Secondary | ICD-10-CM

## 2018-03-08 DIAGNOSIS — D696 Thrombocytopenia, unspecified: Secondary | ICD-10-CM

## 2018-03-08 DIAGNOSIS — D701 Agranulocytosis secondary to cancer chemotherapy: Secondary | ICD-10-CM

## 2018-03-08 DIAGNOSIS — G620 Drug-induced polyneuropathy: Secondary | ICD-10-CM

## 2018-03-08 LAB — CBC W/ AUTO DIFF
BASOPHILS RELATIVE PERCENT: 0.2 %
EOSINOPHILS ABSOLUTE COUNT: 0 10*9/L (ref 0.0–0.4)
EOSINOPHILS RELATIVE PERCENT: 0.4 %
HEMATOCRIT: 27.9 % — ABNORMAL LOW (ref 36.0–46.0)
HEMOGLOBIN: 9.2 g/dL — ABNORMAL LOW (ref 12.0–16.0)
LARGE UNSTAINED CELLS: 2 % (ref 0–4)
LYMPHOCYTES ABSOLUTE COUNT: 0.3 10*9/L — ABNORMAL LOW (ref 1.5–5.0)
LYMPHOCYTES RELATIVE PERCENT: 4.1 %
MEAN CORPUSCULAR HEMOGLOBIN CONC: 33 g/dL (ref 31.0–37.0)
MEAN CORPUSCULAR HEMOGLOBIN: 33.8 pg (ref 26.0–34.0)
MEAN CORPUSCULAR VOLUME: 102.5 fL — ABNORMAL HIGH (ref 80.0–100.0)
MEAN PLATELET VOLUME: 10.9 fL — ABNORMAL HIGH (ref 7.0–10.0)
MONOCYTES ABSOLUTE COUNT: 0.3 10*9/L (ref 0.2–0.8)
MONOCYTES RELATIVE PERCENT: 4.4 %
NEUTROPHILS ABSOLUTE COUNT: 6.1 10*9/L (ref 2.0–7.5)
NEUTROPHILS RELATIVE PERCENT: 88.8 %
RED BLOOD CELL COUNT: 2.73 10*12/L — ABNORMAL LOW (ref 4.00–5.20)
RED CELL DISTRIBUTION WIDTH: 21.7 % — ABNORMAL HIGH (ref 12.0–15.0)
WBC ADJUSTED: 6.9 10*9/L (ref 4.5–11.0)

## 2018-03-08 LAB — COMPREHENSIVE METABOLIC PANEL
ALKALINE PHOSPHATASE: 91 U/L (ref 38–126)
ALT (SGPT): 12 U/L — ABNORMAL LOW (ref 15–48)
ANION GAP: 11 mmol/L (ref 9–15)
AST (SGOT): 22 U/L (ref 14–38)
BILIRUBIN TOTAL: 0.6 mg/dL (ref 0.0–1.2)
BLOOD UREA NITROGEN: 10 mg/dL (ref 7–21)
BUN / CREAT RATIO: 10
CALCIUM: 8.1 mg/dL — ABNORMAL LOW (ref 8.5–10.2)
CHLORIDE: 106 mmol/L (ref 98–107)
CO2: 22 mmol/L (ref 22.0–30.0)
CREATININE: 1 mg/dL (ref 0.60–1.00)
EGFR MDRD AF AMER: >=60 mL/min/{1.73_m2} (ref >=60)
EGFR MDRD NON AF AMER: 54 mL/min/{1.73_m2} — ABNORMAL LOW (ref >=60)
GLUCOSE RANDOM: 100 mg/dL (ref 65–179)
POTASSIUM: 4.7 mmol/L (ref 3.5–5.0)
PROTEIN TOTAL: 8.1 g/dL (ref 6.5–8.3)

## 2018-03-08 LAB — LAMBDA FREE, SER: Lab: <0.25 — ABNORMAL LOW

## 2018-03-08 LAB — GAMMAGLOBULIN; IGG: IgG:MCnc:Pt:Ser/Plas:Qn:: 3919 — ABNORMAL HIGH

## 2018-03-08 LAB — MAGNESIUM: Magnesium:MCnc:Pt:Ser/Plas:Qn:: 1.7

## 2018-03-08 LAB — HEMATOCRIT: Lab: 27.9 — ABNORMAL LOW

## 2018-03-08 LAB — IMMUNOGLOBULIN FREE LT CHAINS BLOOD: LAMBDA FREE, SER: <0.25 mg/dL — ABNORMAL LOW (ref 0.57–2.63)

## 2018-03-08 LAB — MYELOMA SERUM CHEMISTRIES
GAMMAGLOBULIN; IGG: 3919 mg/dL — ABNORMAL HIGH (ref 600–1700)
PROTEIN TOTAL: 7.8 g/dL (ref 6.5–8.3)

## 2018-03-08 LAB — CORTISOL TOTAL: Cortisol:MCnc:Pt:Ser/Plas:Qn:: 10.6

## 2018-03-08 LAB — THYROID STIMULATING HORMONE: Thyrotropin:ACnc:Pt:Ser/Plas:Qn:: 8.017 — ABNORMAL HIGH

## 2018-03-08 LAB — CALCIUM: Calcium:MCnc:Pt:Ser/Plas:Qn:: 8.1 — ABNORMAL LOW

## 2018-03-08 NOTE — Unmapped (Signed)
[facility] Triage Note     Patient: Kayla Torres     Reason for call:  [facility] RN requesting treatment plan    Time call returned: 0855     Phone Assessment: Azell Der RN said she had asked pt about her Chemo Plan but pt was unable. Suggested she call [facility] for details.    Also pt wanted to know if hemoglobin had improved since last week.     Triage Recommendations: Reviewed pt's treatment plan with [facility] RN    Informed her that pt's labs have not resulted from this week but that provider will review labs with pt during clinic visit     Patient Response: NA     Outstanding tasks: None       Patient Pharmacy has been verified and primary pharmacy has been marked as preferred

## 2018-03-08 NOTE — Unmapped (Signed)
#1 Multiple myeloma  #2 Fatigue    1. Continue to HOLD chemotherapy.  Once blood counts have recovered, platelets >100, ANC >1.5, resume MELPHALAN and prednisone only.  2. Pending bloodwork: TSH and cortisol to evaluate fatigue  3. Trial prednisone 20mg  by mouth daily for fatigue  4. Follow-up with our clinical pharmacist, [place] Miller on 03/23/18, to determine if safe to resume melphalan  5. If fatigue is not improving with prednisone and thyroid testing is normal, consider (1) bone marrow biopsy, (2) hospice, (3) drug such as modafanil    Rozetta Nunnery, M.D.  Assistant Professor  Division of Hematology-Oncology    [address]  3rd floor Phys[address] [address]  [address]  [place] [address]    Nurse Navigator: Layla Maw, RN - [phone]  Questions and appointments M-F 8am - 5pm: [phone] or [phone]          Results for orders placed or performed in visit on 03/08/18   Comprehensive Metabolic Panel   Result Value Ref Range    Sodium 139 135 - 145 mmol/L    Potassium 4.7 3.5 - 5.0 mmol/L    Chloride 106 98 - 107 mmol/L    CO2 22.0 22.0 - 30.0 mmol/L    BUN 10 7 - 21 mg/dL    Creatinine 4.01 0.27 - 1.00 mg/dL    BUN/Creatinine Ratio 10     EGFR MDRD Non Af Amer 54 (L) >=60 mL/min/1.51m2    EGFR MDRD Af Amer >=60 >=60 mL/min/1.68m2    Anion Gap 11 9 - 15 mmol/L    Glucose 100 65 - 179 mg/dL    Calcium 8.1 (L) 8.5 - 10.2 mg/dL    Albumin 3.2 (L) 3.5 - 5.0 g/dL    Total Protein 8.1 6.5 - 8.3 g/dL    Total Bilirubin 0.6 0.0 - 1.2 mg/dL    AST 22 14 - 38 U/L    ALT 12 (L) 15 - 48 U/L    Alkaline Phosphatase 91 38 - 126 U/L   Magnesium Level   Result Value Ref Range    Magnesium 1.7 1.6 - 2.2 mg/dL   CBC w/ Differential   Result Value Ref Range    WBC 6.9 4.5 - 11.0 10*9/L    RBC 2.73 (L) 4.00 - 5.20 10*12/L    HGB 9.2 (L) 12.0 - 16.0 g/dL    HCT 25.3 (L) 66.4 - 46.0 %    MCV 102.5 (H) 80.0 - 100.0 fL    MCH 33.8 26.0 - 34.0 pg    MCHC 33.0 31.0 - 37.0 g/dL    RDW 40.3 (H) 47.4 - 15.0 %    MPV 10.9 (H) 7.0 - 10.0 fL    Platelet 43 (L) 150 - 440 10*9/L    Neutrophils % 88.8 %    Lymphocytes % 4.1 %    Monocytes % 4.4 %    Eosinophils % 0.4 %    Basophils % 0.2 %    Neutrophil Left Shift 1+ (A) Not Present    Absolute Neutrophils 6.1 2.0 - 7.5 10*9/L    Absolute Lymphocytes 0.3 (L) 1.5 - 5.0 10*9/L    Absolute Monocytes 0.3 0.2 - 0.8 10*9/L    Absolute Eosinophils 0.0 0.0 - 0.4 10*9/L    Absolute Basophils 0.0 0.0 - 0.1 10*9/L    Large Unstained Cells 2 0 - 4 %    Macrocytosis Marked (A) Not Present    Anisocytosis Moderate (A) Not Present  Hypochromasia Slight (A) Not Present

## 2018-03-08 NOTE — Unmapped (Signed)
[facility] Follow Up      REASON FOR VISIT: Kayla Torres presents today for follow-up of multiple myeloma, having progressed on all available lines of therapy.    ASSESSMENT:  #1 IgG kappa symptomatic multiple myeloma - ISS Stage II    #2 CKD, Stage 3B, moderate (CrCl estimated 32.1 mL/min).  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   #6 Pancytopenia 2/2 melphalan + ixazomib - resolving     Current Treatment: retrialing mel/ixa/pred  Best Response: mixed repsonse  Current Response: tbd    Kayla Torres is doing well overall.  She feels well, though is a bit tired today because she was up late sleeping.  She did well with her 2nd cycle of mel/ixa/pred, and had pancytopenia in the 3rd week.  We treated with PRBCs, platelets, and Neulasta.  Her counts have recovered nicely.  Today, her creatinine is down to 1.0, which is below her baseline.  She tolerates chemo well, aside from the expected pancytopenia.  She will not start another cycle quite yet given platelets of 43k.  Will have her see [place] Miller in 2 weeks to potentially start cycle 3.    We will likely move forward with Mel/Pred only, omitting the ixazomib.  See Dr. Anise Salvo' attestation for final detail.     Unsure why she is having bilateral LE weakness.  There are no significant electrolyte disturbances.  Adding on a TSH and cortisone for weakness and fatigue.    Will have her start calcium/vitamin d 1200mg -600iu po BID for hypocalcemia.         RECOMMENDATIONS:  1. Move forward with Mel/Pred only once counts recover  2. Continue valacyclovir 500mg  po daily for zoster prophy  3. PT referral - will fax to [facility] per request  4. Start calcium/vitamin d as above  5. Follow up in 2 weeks to start cycle 3 with [place] Miller  6. Follow up in 4 weeks with me    [name], AGPCNP-C, MSN, OCN  Nurse Practitioner  Hematologic Malignancies  [facility]  [phone] (phone)  [phone] (fax)  [email or web address]            ONCOLOGY HISTORY:  Oncology History    Diagnosis:  Symptomatic IgG kappa multiple myeloma, 01/11/14.  Ms. Koppenhaver was diagnosed with symptomatic myeloma at the age of 77 year old when she presented with symptomatic hypercalcemia.  Evaluation revealed IgG kappa symptomatic multiple myeloma based upon SPEP M-protein of 2.5 g/dL, anemia, hypercalcemia, and lytic lesions documented on skeletal survey. Bone marrow biopsy performed 01/11/14 showed normocellular bone marrow (60%) with involvement by plasma cell dyscrasia (35% plasma cells by aspirate differential, 80% plasma cells by CD138 immunohistochemical analysis of clot, and monotypic kappa by in-situ hybridization).  IgG elevated at 3208 mg/dL, IgM <42, IgA 706.     Stage @ Diagnosis: ISS 2 (B2M 4.11).   ISS I (B2M <3.5 mg/dL and albumin >2.3 g/dL) - Median OS 62 months  ISS II - neither I nor III. - Median OS 44 months  ISS III - B2M >5.5 mg/dL. - Median OS 29 months      Risk Stratification:  High risk based upon +1q  Cytogenetics:    Normal karyotype: 46,XY,inv(9)(p11q13)c[20]   Abnormal FISH: A multiple myeloma FISH panel, on a CD138+ plasma cell-enriched fraction, performed at [facility]) was abnormal. A  percentage of the cells were positive for three 1q signals, monosomy 13, and trisomy 9 and 15. Gain of 1q is associated with a worse prognosis in myeloma patients.     LDH: normal at diagnosis    Treatment History/Disease Course:  1. CyBorD - x 9 cycles. VGPR, best response.  2. Lenalidomide maintenance - 10/2014 - n/v/malaise --> AKI. Subsequently changed to bortezomib q2week maintenance --> PD 07/25/15, with M-spike increased from TLTQ to 2.0 g/dL.    3. Carfilzomib 36mg /m2 + dexamethasone  TTE 07/2015 with normal EF. Addition of revlimid 15 mg 21/28 day cycle on C2. Progression 02/27/16 4. Daratumumab initiated 03/05/16 - PD  5. Cy/Pom/Dex - 04/16/16  6. Benda/Pom/Dex 03/10/17        Bone Health:  - Myeloma survey from 01/09/14: Lateral views of the calvarium show multiple small lytic lesions throughout calvarium. There is diffuse osteopenia throughout the spine. A severe compression fracture at L1 includes mild loss of the posterior vertebral body height but no listhesis. There is also endplate invagination superiorly and inferiorly at L5.  AP views of the appendicular skeleton again show diffuse osteopenia. Lytic lesions are present in the proximal and distal right femur, proximal left femur and left proximal humerus. No pathologic fracture is seen at this time.  - Bisphosphonate:  Received pamidronate in 12/2013 while hospitalized.  Starting monthly zoledronic acid 4mg  IV monthly x 2 years on 02/22/14.    Transplant Status:  Not transplanted; had initial visit with Dr. Lucretia Roers on 04/03/14. Elected to forgo cell collection or transplant.         Multiple myeloma in relapse (CMS-HCC)    01/11/2014 Initial Diagnosis     Multiple myeloma, IgG kappa.  ISS 2.  Presented with hypercalcemia and anemia.  Renal failure corrected after IV hydration.  M-spike 2.5 g/dL         10/25/1094 -  Chemotherapy     CyBorD         02/22/2014 Endocrine/Hormone Therapy     Zometa Therapy 4 mg IV monthly. Plan for two years of therapy         10/27/2014 -  Chemotherapy     Bortezomib maintenance 1.3 mg/m2  q2 weeks         07/25/2015 Progression     M-spike 2.0.  CKD stable, cr 1.45 mg/dL.  Hgb 10.6, slightly reduced from steady-state.         08/08/2015 - 02/27/2016 Chemotherapy     Carfilzomib + dexamethaonse with addition of revlimid 15 mg 21/28 day cycle on C2. 10/03/15 C3 revlimid discontinued after one cycle (C2). C5 delayed a week for URI.         02/17/2016 Progression            02/27/2016 Progression     M-spike to 2.3         03/05/2016 -  Chemotherapy     Single agent Daratumumab         04/14/2016 Progression     M-spike 3.3 up from 2.3 on single agent dara         04/15/2016 -  Chemotherapy     Initiated pom/cy/dex; Pom 4mg  d1-21, Cytoxan 400mg  PO days 1, 8, 15, dex 20mg  PO days 1, 8, 15, 22         05/05/2016 -  Chemotherapy     Dose reduced pomalidomide to 2mg  d/t AKI         03/10/2017 -  Chemotherapy  Benda/Pom/Dex         10/09/2017 Progression     M-spike 1.4.  Initiation of treatment delayed by gangrene of right 4th and 5th toes, with hospitalization            Chemotherapy     Ixazomib/Melphalan/Prednisone  M-spike 3.4            INTERVAL HISTORY:  Kayla Torres presents to clinic today with her daughter for follow up.  She is doing well after another cycle of Mel/Ixa?pred.  Biggest concern is weakness in her legs, which started after beginning this cycle.  She was evaluated by vascular, who did not identify an issue on their end.  Daughter reports that this improved a little bit after the blood transfusion. Pins and needles sensation has improved.    Otherwise, denies new bone pain, fevers, chills, night sweats, lumps/bumps, tongue swelling, shortness of breath, syncope, lightheadedness, constipation, nausea or vomiting, very easy bruising or bleeding, or urinary changes.         REVIEW OF SYSTEMS:    10-systems otherwise reviewed and negative except as per Interval History.    PAST MEDICAL HISTORY:    Past Medical History:   Diagnosis Date   ??? Arthritis    ??? High cholesterol    ??? Hypertension    ??? Multiple myeloma (CMS-HCC) 01/18/2014         PAST SURGICAL HISTORY:    Past Surgical History:   Procedure Laterality Date   ??? negative surgical history     ??? PR AMPUTATION TOE,MT-P JT Right 10/24/2017    Procedure: AMPUTATION, TOE; METATARSOPHALANGEAL JOINT;  Surgeon: Boykin Reaper, MD;  Location: MAIN OR [facility];  Service: Vascular         ALLERGIES: No Known Allergies    MEDICATIONS:    Current Outpatient Medications   Medication Sig Dispense Refill   ??? acetaminophen (TYLENOL) 325 MG tablet Take 2 tablets (650 mg total) by mouth every six (6) hours as needed. 30 tablet 0   ??? alendronate (FOSAMAX) 70 MG tablet Take 1 tablet by mouth once a week. Takes on Tuesday AM     ??? amLODIPine (NORVASC) 5 MG tablet Take 5 mg by mouth daily.      ??? clopidogrel (PLAVIX) 75 mg tablet Take 1 tablet (75 mg total) by mouth daily. 90 tablet 30   ??? colchicine (COLCRYS) 0.6 mg tablet Take 1 tablet (0.6 mg total) by mouth Every Monday, Wednesday, and Friday. 12 tablet 3   ??? dexamethasone (DECADRON) 4 MG tablet Take 2 tablets (8 mg total) by mouth 2 (two) times a day with meals. 120 tablet 0   ??? ixazomib (NINLARO) 3 mg capsule Take 1 capsule by mouth daily on days 1, 8, and 15. Take at least 1 hour before or at least 1 hours after food. 3 capsule 2   ??? magnesium oxide (MAG-OX) 400 mg (241.3 mg magnesium) tablet Take 1 tablet (400 mg total) by mouth Two (2) times a day. 120 tablet 2   ??? melphalan (ALKERAN) 2 mg tablet Take 5 tabs (10 mg) by mouth daily on days 1-4 of a 28-day cycle. 20 tablet 2   ??? metoprolol tartrate (LOPRESSOR) 25 MG tablet Take 0.5 tablets (12.5 mg total) by mouth Two (2) times a day. 30 tablet 0   ??? mirtazapine (REMERON) 45 MG tablet Take 1 tablet (45 mg total) by mouth nightly. 30 tablet 6   ??? ondansetron (ZOFRAN-ODT) 4 MG disintegrating tablet Take 1  tablet (4 mg total) by mouth every eight (8) hours as needed for nausea. 60 tablet 3   ??? oxyCODONE (ROXICODONE) 5 MG immediate release tablet      ??? polyethylene glycol (MIRALAX) 17 gram packet Take 17 g by mouth daily as needed. 10 packet 0   ??? potassium chloride (KLOR-CON) 10 MEQ CR tablet Take 2 tablets (20 mEq total) by mouth daily. 30 tablet 11   ??? predniSONE (DELTASONE) 50 MG tablet Take 2 tablets (100 mg) by mouth daily on days 1-4 of a 28-day cycle 8 tablet 2   ??? senna (SENOKOT) 8.6 mg tablet Take 1 tablet by mouth nightly as needed for constipation. 10 tablet 0   ??? silver sulfaDIAZINE (SILVADENE) 1 % cream Apply topically daily. 100 g 2   ??? sodium hypochlorite (DAKIN'S, HALF-STRENGTH,) external solution Apply topically daily. Use as a wound cleanser, pat dry, then apply dressing 473 mL 1   ??? traMADol (ULTRAM) 50 mg tablet Take 0.5 tablets (25 mg total) by mouth every eight (8) hours as needed for pain. 60 tablet 0   ??? valACYclovir (VALTREX) 500 MG tablet Take 1 tablet (500 mg total) by mouth daily. 30 tablet 6   ??? zinc gluconate 100 mg Tab Take 100 mg by mouth daily at 0600. 30 each 11     No current facility-administered medications for this visit.          PHYSICAL EXAMINATION:  VITALS:   There were no vitals filed for this visit.    ECOG PERFORMANCE STATUS: 1    GENERAL:No acute distress. She is ambulatory without assistive devices today.  She is accompanied by her daughter, Kayla Torres.  HEENT: Anicteric sclera. No conjunctival pallor. No oropharyngeal exudates, erythema, nor ulceration.  Dentures are poorly fitted and affect her speech.  LYMPH: No cervical, supraclavicular, nor axillary adenopathy.  CV: S1,S2, no murmurs, rubs, nor gallops.    LUNGS: Speaking comfortably in full sentences without increased work of breathing. Clear to auscultation bilaterally.  ABDOMEN: Soft, non-tender, and non-distended. Normoactive bowel sounds. No hepatosplenomegaly.  EXTREMITIES: No edema.    LABS/IMAGING/OTHER TESTS:  No visits with results within 2 Day(s) from this visit.   Latest known visit with results is:   Lab on 03/02/2018   Component Date Value Ref Range Status   ??? WBC 03/02/2018 3.8* 4.5 - 11.0 10*9/L Final   ??? RBC 03/02/2018 2.96* 4.00 - 5.20 10*12/L Final   ??? HGB 03/02/2018 10.1* 12.0 - 16.0 g/dL Final   ??? HCT 16/07/9603 30.2* 36.0 - 46.0 % Final   ??? MCV 03/02/2018 102.3* 80.0 - 100.0 fL Final   ??? MCH 03/02/2018 34.3* 26.0 - 34.0 pg Final   ??? MCHC 03/02/2018 33.5  31.0 - 37.0 g/dL Final   ??? RDW 54/06/8118 21.3* 12.0 - 15.0 % Final   ??? MPV 03/02/2018 10.0  7.0 - 10.0 fL Final   ??? Platelet 03/02/2018 20* 150 - 440 10*9/L Final   ??? Neutrophils % 03/02/2018 84.7  % Final   ??? Lymphocytes % 03/02/2018 3.9 % Final   ??? Monocytes % 03/02/2018 8.4  % Final   ??? Eosinophils % 03/02/2018 0.1  % Final   ??? Basophils % 03/02/2018 0.2  % Final   ??? Neutrophil Left Shift 03/02/2018 3+* Not Present Final   ??? Absolute Neutrophils 03/02/2018 3.2  2.0 - 7.5 10*9/L Final   ??? Absolute Lymphocytes 03/02/2018 0.2* 1.5 - 5.0 10*9/L Final   ??? Absolute Monocytes 03/02/2018 0.3  0.2 - 0.8 10*9/L Final   ??? Absolute Eosinophils 03/02/2018 0.0  0.0 - 0.4 10*9/L Final   ??? Absolute Basophils 03/02/2018 0.0  0.0 - 0.1 10*9/L Final   ??? Large Unstained Cells 03/02/2018 3  0 - 4 % Final   ??? Macrocytosis 03/02/2018 Marked* Not Present Final   ??? Anisocytosis 03/02/2018 Moderate* Not Present Final   ??? Smear Review Comments 03/02/2018 See Comment* Undefined Final    Slide reviewed     ??? Dohle Bodies 03/02/2018 Present* Not Present Final   ??? Toxic Granulation 03/02/2018 Present* Not Present Final

## 2018-03-08 NOTE — Unmapped (Signed)
0840:  Labs drawn and sent for analysis.  Care provided by  Harvel Quale, RN

## 2018-03-09 MED ORDER — PREDNISONE 20 MG TABLET
ORAL_TABLET | Freq: Every day | ORAL | 2 refills | 0 days | Status: CP
Start: 2018-03-09 — End: 2018-04-21

## 2018-03-09 MED FILL — MELPHALAN/2MG/TABS: MELPHALAN/2MG/TABS | 28 days supply | Qty: 20 | Fill #1

## 2018-03-09 MED FILL — NINLARO/3MG/CAPS: NINLARO/3MG/CAPS | 28 days supply | Qty: 3 | Fill #1

## 2018-03-09 MED FILL — PREDNISONE/50MG/TAB: PREDNISONE/50MG/TAB | 28 days supply | Qty: 8 | Fill #1

## 2018-03-10 LAB — MYELOMA WORKUP, SERUM
ALBUMIN (SPE): 2.8 g/dL — ABNORMAL LOW (ref 3.5–5.0)
ALPHA-1 GLOBULIN: 0.5 g/dL (ref 0.2–0.5)
ALPHA-2 GLOBULIN: 0.9 g/dL (ref 0.5–1.1)
BETA-1 GLOBULIN: 0.1 g/dL — ABNORMAL LOW (ref 0.3–0.6)
BETA-2 GLOBULIN: 0.2 g/dL (ref 0.2–0.6)
GAMMAGLOBULIN: 3.3 g/dL — ABNORMAL HIGH (ref 0.5–1.5)
PROTEIN TOTAL: 7.8 g/dL (ref 6.5–8.3)

## 2018-03-10 LAB — BETA-1 GLOBULIN: Lab: 0.1 — ABNORMAL LOW

## 2018-03-10 NOTE — Unmapped (Signed)
Referral faxed to [facility]

## 2018-03-24 ENCOUNTER — Encounter: Admit: 2018-03-24 | Discharge: 2018-03-24 | Payer: MEDICARE

## 2018-03-24 ENCOUNTER — Ambulatory Visit: Admit: 2018-03-24 | Discharge: 2018-03-24 | Payer: MEDICARE | Attending: Adult Health | Primary: Adult Health

## 2018-03-24 ENCOUNTER — Encounter: Admit: 2018-03-24 | Discharge: 2018-03-24 | Payer: MEDICARE | Attending: Oncology | Primary: Oncology

## 2018-03-24 DIAGNOSIS — T451X5A Adverse effect of antineoplastic and immunosuppressive drugs, initial encounter: Secondary | ICD-10-CM

## 2018-03-24 DIAGNOSIS — C9002 Multiple myeloma in relapse: Principal | ICD-10-CM

## 2018-03-24 DIAGNOSIS — D61818 Other pancytopenia: Secondary | ICD-10-CM

## 2018-03-24 DIAGNOSIS — N183 Chronic kidney disease, stage 3 (moderate): Secondary | ICD-10-CM

## 2018-03-24 DIAGNOSIS — R7989 Other specified abnormal findings of blood chemistry: Secondary | ICD-10-CM

## 2018-03-24 DIAGNOSIS — G620 Drug-induced polyneuropathy: Secondary | ICD-10-CM

## 2018-03-24 DIAGNOSIS — R946 Abnormal results of thyroid function studies: Secondary | ICD-10-CM

## 2018-03-24 DIAGNOSIS — G4700 Insomnia, unspecified: Secondary | ICD-10-CM

## 2018-03-24 DIAGNOSIS — C9000 Multiple myeloma not having achieved remission: Principal | ICD-10-CM

## 2018-03-24 LAB — CBC W/ AUTO DIFF
BASOPHILS ABSOLUTE COUNT: 0 10*9/L (ref 0.0–0.1)
BASOPHILS RELATIVE PERCENT: 0.5 %
EOSINOPHILS ABSOLUTE COUNT: 0 10*9/L (ref 0.0–0.4)
HEMATOCRIT: 30.9 % — ABNORMAL LOW (ref 36.0–46.0)
HEMOGLOBIN: 10.3 g/dL — ABNORMAL LOW (ref 12.0–16.0)
LARGE UNSTAINED CELLS: 2 % (ref 0–4)
LYMPHOCYTES ABSOLUTE COUNT: 0.3 10*9/L — ABNORMAL LOW (ref 1.5–5.0)
LYMPHOCYTES RELATIVE PERCENT: 3 %
MEAN CORPUSCULAR HEMOGLOBIN CONC: 33.2 g/dL (ref 31.0–37.0)
MEAN CORPUSCULAR HEMOGLOBIN: 34.6 pg — ABNORMAL HIGH (ref 26.0–34.0)
MEAN CORPUSCULAR VOLUME: 104.2 fL — ABNORMAL HIGH (ref 80.0–100.0)
MEAN PLATELET VOLUME: 9.6 fL (ref 7.0–10.0)
MONOCYTES ABSOLUTE COUNT: 0.4 10*9/L (ref 0.2–0.8)
MONOCYTES RELATIVE PERCENT: 4.4 %
NEUTROPHILS ABSOLUTE COUNT: 7.9 10*9/L — ABNORMAL HIGH (ref 2.0–7.5)
NEUTROPHILS RELATIVE PERCENT: 90.4 %
RED BLOOD CELL COUNT: 2.96 10*12/L — ABNORMAL LOW (ref 4.00–5.20)
RED CELL DISTRIBUTION WIDTH: 21 % — ABNORMAL HIGH (ref 12.0–15.0)
WBC ADJUSTED: 8.8 10*9/L (ref 4.5–11.0)

## 2018-03-24 LAB — IMMUNOGLOBULIN FREE LT CHAINS BLOOD
KAPPA FREE,SERUM: 66.25 mg/dL — ABNORMAL HIGH (ref 0.33–1.94)
LAMBDA FREE, SER: <0.25 mg/dL — ABNORMAL LOW (ref 0.57–2.63)

## 2018-03-24 LAB — COMPREHENSIVE METABOLIC PANEL
ALBUMIN: 3.6 g/dL (ref 3.5–5.0)
ALKALINE PHOSPHATASE: 82 U/L (ref 38–126)
ALT (SGPT): <8 U/L — ABNORMAL LOW (ref 15–48)
ANION GAP: 9 mmol/L (ref 9–15)
AST (SGOT): 15 U/L (ref 14–38)
BLOOD UREA NITROGEN: 14 mg/dL (ref 7–21)
BUN / CREAT RATIO: 14
CHLORIDE: 107 mmol/L (ref 98–107)
CO2: 25 mmol/L (ref 22.0–30.0)
CREATININE: 1 mg/dL (ref 0.60–1.00)
EGFR MDRD AF AMER: >=60 mL/min/{1.73_m2} (ref >=60)
EGFR MDRD NON AF AMER: 54 mL/min/{1.73_m2} — ABNORMAL LOW (ref >=60)
GLUCOSE RANDOM: 107 mg/dL (ref 65–179)
POTASSIUM: 4 mmol/L (ref 3.5–5.0)
PROTEIN TOTAL: 8.9 g/dL — ABNORMAL HIGH (ref 6.5–8.3)
SODIUM: 141 mmol/L (ref 135–145)

## 2018-03-24 LAB — K/L FLC RATIO: Lab: 0

## 2018-03-24 LAB — SMEAR REVIEW

## 2018-03-24 LAB — FREE T4: Thyroxine.free:MCnc:Pt:Ser/Plas:Qn:: 0.7 — ABNORMAL LOW

## 2018-03-24 LAB — EOSINOPHILS RELATIVE PERCENT: Lab: 0.3

## 2018-03-24 LAB — MAGNESIUM: Magnesium:MCnc:Pt:Ser/Plas:Qn:: 1.9

## 2018-03-24 LAB — MYELOMA SERUM CHEMISTRIES
GAMMAGLOBULIN; IGA: 9.4 mg/dL — ABNORMAL LOW (ref 40.0–400.0)
PROTEIN TOTAL: 8.6 g/dL — ABNORMAL HIGH (ref 6.5–8.3)

## 2018-03-24 LAB — CALCIUM: Calcium:MCnc:Pt:Ser/Plas:Qn:: 9.1

## 2018-03-24 LAB — PROTEIN TOTAL: Protein:MCnc:Pt:Ser/Plas:Qn:: 8.6 — ABNORMAL HIGH

## 2018-03-24 MED ORDER — LEVOTHYROXINE 25 MCG TABLET
ORAL_TABLET | Freq: Every day | ORAL | 1 refills | 0 days | Status: CP
Start: 2018-03-24 — End: 2018-05-23

## 2018-03-24 MED ORDER — MIRTAZAPINE 45 MG TABLET
ORAL_TABLET | Freq: Every evening | ORAL | 11 refills | 0 days | Status: CP
Start: 2018-03-24 — End: 2019-03-24

## 2018-03-24 NOTE — Unmapped (Signed)
[facility] Follow Up      REASON FOR VISIT: Ms. Labine presents today for follow-up of multiple myeloma, having progressed on all available lines of therapy.    ASSESSMENT:  #1 IgG kappa symptomatic multiple myeloma - ISS Stage II    #2 CKD, Stage 3B, moderate (CrCl estimated 32.1 mL/min).  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   #6 Pancytopenia 2/2 melphalan + ixazomib - resolving     Current Treatment: melphalan/prednisone  Best Response: mixed repsonse  Current Response: tbd    Ms. Connell is doing well overall.  Her energy has improved with the initiation of prednisone.  Her TSH when checked at her last visit was 8.  Added T4 today and it is marginally low.  Will initiate low dose levothyroxine and refer to endocrinology.  This may be an explanation for her worsening fatigue and weakness.      Today her counts are high enough to begin her next cycle of chemo (plts 99k, so reasonable to start).  We will drop the ixazomib and continue with melphalan and prednisone, per Dr. Anise Salvo' note last visit.  We will again have lab checks on weeks 3-5 to make sure that she does not need transfusion or GCSF support.  She would prefer to go to [place] for labs, with the understanding she may need to come here for transfusion.    Her physical therapy referral was canceled somehow, so will resend this.  She also needs her last clinical note to be sent for approval for a wheelchair.  A wheelchair would be helpful within the home and in the community, as the patient cannot walk more than 15-20 ft and has trouble with stairs.      Neuropathy is stable - no changes to her current plan.    RECOMMENDATIONS:  1. Move forward with Mel/Pred only   2. Continue valacyclovir 500mg  po daily for zoster prophy  3. PT referral - will fax to [facility] per request  4. Continue calcium/vitamin d   5. Labs weekly days 15, 22, 29  5. Follow up in 5-6 weeks    [name], AGPCNP-C, MSN, [facility]  Nurse Practitioner  Hematologic Malignancies  [facility]  [phone] (phone)  [phone] (fax)  [email or web address]            ONCOLOGY HISTORY:  Oncology History    Diagnosis:  Symptomatic IgG kappa multiple myeloma, 01/11/14.  Ms. Cargo was diagnosed with symptomatic myeloma at the age of 77 year old when she presented with symptomatic hypercalcemia.  Evaluation revealed IgG kappa symptomatic multiple myeloma based upon SPEP M-protein of 2.5 g/dL, anemia, hypercalcemia, and lytic lesions documented on skeletal survey. Bone marrow biopsy performed 01/11/14 showed normocellular bone marrow (60%) with involvement by plasma cell dyscrasia (35% plasma cells by aspirate differential, 80% plasma cells by CD138 immunohistochemical analysis of clot, and monotypic kappa by in-situ hybridization).  IgG elevated at 3208 mg/dL, IgM <29, IgA 562.     Stage @ Diagnosis: ISS 2 (B2M 4.11).   ISS I (B2M <3.5 mg/dL and albumin >1.3 g/dL) - Median OS 62 months  ISS II - neither I nor III. - Median OS 44 months  ISS III - B2M >5.5 mg/dL. - Median OS 29 months      Risk Stratification:  High risk based upon +1q  Cytogenetics:  Normal karyotype: 46,XY,inv(9)(p11q13)c[20]   Abnormal FISH: A multiple myeloma FISH panel, on a CD138+ plasma cell-enriched fraction, performed at [facility]) was abnormal. A percentage of the cells were positive for three 1q signals, monosomy 13, and trisomy 9 and 15. Gain of 1q is associated with a worse prognosis in myeloma patients.     LDH: normal at diagnosis    Treatment History/Disease Course:  1. CyBorD - x 9 cycles. VGPR, best response.  2. Lenalidomide maintenance - 10/2014 - n/v/malaise --> AKI. Subsequently changed to bortezomib q2week maintenance --> PD 07/25/15, with M-spike increased from TLTQ to 2.0 g/dL.    3. Carfilzomib 36mg /m2 + dexamethasone  TTE 07/2015 with normal EF. Addition of revlimid 15 mg 21/28 day cycle on C2. Progression 02/27/16  4. Daratumumab initiated 03/05/16 - PD  5. Cy/Pom/Dex - 04/16/16  6. Benda/Pom/Dex 03/10/17        Bone Health:  - Myeloma survey from 01/09/14: Lateral views of the calvarium show multiple small lytic lesions throughout calvarium. There is diffuse osteopenia throughout the spine. A severe compression fracture at L1 includes mild loss of the posterior vertebral body height but no listhesis. There is also endplate invagination superiorly and inferiorly at L5.  AP views of the appendicular skeleton again show diffuse osteopenia. Lytic lesions are present in the proximal and distal right femur, proximal left femur and left proximal humerus. No pathologic fracture is seen at this time.  - Bisphosphonate:  Received pamidronate in 12/2013 while hospitalized.  Starting monthly zoledronic acid 4mg  IV monthly x 2 years on 02/22/14.    Transplant Status:  Not transplanted; had initial visit with Dr. Lucretia Roers on 04/03/14. Elected to forgo cell collection or transplant.         Multiple myeloma in relapse (CMS-HCC)    01/11/2014 Initial Diagnosis     Multiple myeloma, IgG kappa.  ISS 2.  Presented with hypercalcemia and anemia.  Renal failure corrected after IV hydration.  M-spike 2.5 g/dL         03/28/6294 -  Chemotherapy     CyBorD         02/22/2014 Endocrine/Hormone Therapy     Zometa Therapy 4 mg IV monthly. Plan for two years of therapy         10/27/2014 -  Chemotherapy     Bortezomib maintenance 1.3 mg/m2 Rudy q2 weeks         07/25/2015 Progression     M-spike 2.0.  CKD stable, cr 1.45 mg/dL.  Hgb 10.6, slightly reduced from steady-state.         08/08/2015 - 02/27/2016 Chemotherapy     Carfilzomib + dexamethaonse with addition of revlimid 15 mg 21/28 day cycle on C2. 10/03/15 C3 revlimid discontinued after one cycle (C2). C5 delayed a week for URI.         02/17/2016 Progression            02/27/2016 Progression     M-spike to 2.3         03/05/2016 -  Chemotherapy     Single agent Daratumumab         04/14/2016 Progression     M-spike 3.3 up from 2.3 on single agent dara         04/15/2016 -  Chemotherapy     Initiated pom/cy/dex; Pom 4mg  d1-21, Cytoxan 400mg  PO days 1, 8, 15, dex 20mg  PO days 1, 8, 15, 22         05/05/2016 -  Chemotherapy     Dose reduced pomalidomide to 2mg  d/t AKI         03/10/2017 -  Chemotherapy     Benda/Pom/Dex         10/09/2017 Progression     M-spike 1.4.  Initiation of treatment delayed by gangrene of right 4th and 5th toes, with hospitalization            Chemotherapy     Ixazomib/Melphalan/Prednisone  M-spike 3.4            INTERVAL HISTORY:  Mrs. Bene presents to clinic today with her daughter for follow up.  Doing well.  Prednisone is helping her eating.  Still has problems with weakness, though her daughter notes her walking is a little better.  Has difficulty getting up from a seated position.  PT referral was canceled by other provider.      Otherwise, denies new bone pain, fevers, chills, night sweats, lumps/bumps, tongue swelling, shortness of breath, syncope, lightheadedness, constipation, nausea or vomiting, very easy bruising or bleeding, or urinary changes.         REVIEW OF SYSTEMS:    10-systems otherwise reviewed and negative except as per Interval History.    PAST MEDICAL HISTORY:    Past Medical History:   Diagnosis Date   ??? Arthritis    ??? High cholesterol    ??? Hypertension    ??? Multiple myeloma (CMS-HCC) 01/18/2014         PAST SURGICAL HISTORY:    Past Surgical History:   Procedure Laterality Date   ??? negative surgical history     ??? PR AMPUTATION TOE,MT-P JT Right 10/24/2017    Procedure: AMPUTATION, TOE; METATARSOPHALANGEAL JOINT;  Surgeon: Boykin Reaper, MD;  Location: MAIN OR [facility];  Service: Vascular         ALLERGIES: No Known Allergies    MEDICATIONS:    Current Outpatient Medications   Medication Sig Dispense Refill   ??? acetaminophen (TYLENOL) 325 MG tablet Take 2 tablets (650 mg total) by mouth every six (6) hours as needed. 30 tablet 0   ??? alendronate (FOSAMAX) 70 MG tablet Take 1 tablet by mouth once a week. Takes on Tuesday AM     ??? amLODIPine (NORVASC) 5 MG tablet Take 5 mg by mouth daily.      ??? clopidogrel (PLAVIX) 75 mg tablet Take 1 tablet (75 mg total) by mouth daily. 90 tablet 30   ??? colchicine (COLCRYS) 0.6 mg tablet Take 1 tablet (0.6 mg total) by mouth Every Monday, Wednesday, and Friday. 12 tablet 3   ??? magnesium oxide (MAG-OX) 400 mg (241.3 mg magnesium) tablet Take 1 tablet (400 mg total) by mouth Two (2) times a day. 120 tablet 2   ??? melphalan (ALKERAN) 2 mg tablet Take 5 tabs (10 mg) by mouth daily on days 1-4 of a 28-day cycle. 20 tablet 2   ??? metoprolol tartrate (LOPRESSOR) 25 MG tablet Take 0.5 tablets (12.5 mg total) by mouth Two (2) times a day. 30 tablet 0   ??? mirtazapine (REMERON) 45 MG tablet Take 1 tablet (45 mg total) by mouth nightly. 30 tablet 11   ??? ondansetron (ZOFRAN-ODT) 4 MG disintegrating tablet Take 1 tablet (4 mg total) by mouth every eight (8) hours as needed for nausea. 60 tablet 3   ??? oxyCODONE (ROXICODONE) 5 MG immediate release tablet      ??? polyethylene glycol (MIRALAX) 17 gram packet Take 17 g by mouth daily as needed. 10  packet 0   ??? potassium chloride (KLOR-CON) 10 MEQ CR tablet Take 2 tablets (20 mEq total) by mouth daily. 30 tablet 11   ??? predniSONE (DELTASONE) 50 MG tablet Take 2 tablets (100 mg) by mouth daily on days 1-4 of a 28-day cycle 8 tablet 2   ??? senna (SENOKOT) 8.6 mg tablet Take 1 tablet by mouth nightly as needed for constipation. 10 tablet 0   ??? silver sulfaDIAZINE (SILVADENE) 1 % cream Apply topically daily. 100 g 2   ??? traMADol (ULTRAM) 50 mg tablet Take 0.5 tablets (25 mg total) by mouth every eight (8) hours as needed for pain. 60 tablet 0   ??? valACYclovir (VALTREX) 500 MG tablet Take 1 tablet (500 mg total) by mouth daily. 30 tablet 6   ??? zinc gluconate 100 mg Tab Take 100 mg by mouth daily at 0600. 30 each 11   ??? ferrous sulfate 325 (65 FE) MG tablet Take 325 mg by mouth daily.     ??? predniSONE (DELTASONE) 20 MG tablet Take 1 tablet (20 mg total) by mouth daily. (Patient not taking: Reported on 03/24/2018) 30 tablet 2     No current facility-administered medications for this visit.          PHYSICAL EXAMINATION:  VITALS:   Vitals:    03/24/18 0920 03/24/18 0926   BP: 193/85 (S) 150/80   Pulse: 67    Resp: 16    Temp: 36.7 ??C (98.1 ??F)    TempSrc: Oral    SpO2: 97%        ECOG PERFORMANCE STATUS: 1    GENERAL:No acute distress. She presents in a wheelchair.  She is accompanied by her daughter, Misty Stanley.  HEENT: Anicteric sclera. No conjunctival pallor. No oropharyngeal exudates, erythema, nor ulceration.  Dentures are poorly fitted and affect her speech.  LYMPH: No cervical, supraclavicular, nor axillary adenopathy.  CV: S1,S2, no murmurs, rubs, nor gallops.    Resp: Speaking comfortably in full sentences without increased work of breathing. Clear to auscultation bilaterally.  GI: Soft, non-tender, and non-distended. Normoactive bowel sounds. No hepatosplenomegaly.  EXTREMITIES: No edema.    LABS/IMAGING/OTHER TESTS:  Office Visit on 03/24/2018   Component Date Value Ref Range Status   ??? Free T4 03/24/2018 0.70* 0.71 - 1.40 ng/dL Final   Lab on 29/56/2130   Component Date Value Ref Range Status   ??? Sodium 03/24/2018 141  135 - 145 mmol/L Final   ??? Potassium 03/24/2018 4.0  3.5 - 5.0 mmol/L Final   ??? Chloride 03/24/2018 107  98 - 107 mmol/L Final   ??? CO2 03/24/2018 25.0  22.0 - 30.0 mmol/L Final   ??? BUN 03/24/2018 14  7 - 21 mg/dL Final   ??? Creatinine 03/24/2018 1.00  0.60 - 1.00 mg/dL Final   ??? BUN/Creatinine Ratio 03/24/2018 14   Final   ??? EGFR MDRD Non Af Amer 03/24/2018 54* >=60 mL/min/1.66m2 Final   ??? EGFR MDRD Af Amer 03/24/2018 >=60  >=60 mL/min/1.50m2 Final   ??? Anion Gap 03/24/2018 9  9 - 15 mmol/L Final   ??? Glucose 03/24/2018 107  65 - 179 mg/dL Final   ??? Calcium 86/57/8469 9.1  8.5 - 10.2 mg/dL Final ??? Albumin 62/95/2841 3.6  3.5 - 5.0 g/dL Final   ??? Total Protein 03/24/2018 8.9* 6.5 - 8.3 g/dL Final   ??? Total Bilirubin 03/24/2018 0.4  0.0 - 1.2 mg/dL Final   ??? AST 32/44/0102 15  14 - 38 U/L Final   ???  ALT 03/24/2018 <8* 15 - 48 U/L Final   ??? Alkaline Phosphatase 03/24/2018 82  38 - 126 U/L Final   ??? Kappa Free, Serum 03/24/2018 66.25* 0.33 - 1.94 mg/dL Final    Effective November 15th, 2018 a new analyzer for serum free light chain testing was implemented. Please consider the potential need for re-baselining and subsequent monitoring of patients. Further information regarding serum free light chain testing can be found in [facility] memorandum Core #148.   ??? Lambda Free, Serum 03/24/2018 <0.25* 0.57 - 2.63 mg/dL Final    Effective November 15th, 2018 a new analyzer for serum free light chain testing was implemented. Please consider the potential need for re-baselining and subsequent monitoring of patients. Further information regarding serum free light chain testing can be found in [facility] memorandum Core #148.   ??? K/L FLC Ratio 03/24/2018   0.26 - 1.65 Final    Unable to calculate the K/L Ratio due to a free light chain concentration result outside of the clinical reportable range.   ??? Magnesium 03/24/2018 1.9  1.6 - 2.2 mg/dL Final   ??? WBC 57/84/6962 8.8  4.5 - 11.0 10*9/L Final   ??? RBC 03/24/2018 2.96* 4.00 - 5.20 10*12/L Final   ??? HGB 03/24/2018 10.3* 12.0 - 16.0 g/dL Final   ??? HCT 95/28/4132 30.9* 36.0 - 46.0 % Final   ??? MCV 03/24/2018 104.2* 80.0 - 100.0 fL Final   ??? MCH 03/24/2018 34.6* 26.0 - 34.0 pg Final   ??? MCHC 03/24/2018 33.2  31.0 - 37.0 g/dL Final   ??? RDW 44/10/270 21.0* 12.0 - 15.0 % Final   ??? MPV 03/24/2018 9.6  7.0 - 10.0 fL Final   ??? Platelet 03/24/2018 99* 150 - 440 10*9/L Final   ??? Neutrophils % 03/24/2018 90.4  % Final   ??? Lymphocytes % 03/24/2018 3.0  % Final   ??? Monocytes % 03/24/2018 4.4  % Final   ??? Eosinophils % 03/24/2018 0.3  % Final   ??? Basophils % 03/24/2018 0.5  % Final   ??? Absolute Neutrophils 03/24/2018 7.9* 2.0 - 7.5 10*9/L Final   ??? Absolute Lymphocytes 03/24/2018 0.3* 1.5 - 5.0 10*9/L Final   ??? Absolute Monocytes 03/24/2018 0.4  0.2 - 0.8 10*9/L Final   ??? Absolute Eosinophils 03/24/2018 0.0  0.0 - 0.4 10*9/L Final   ??? Absolute Basophils 03/24/2018 0.0  0.0 - 0.1 10*9/L Final   ??? Large Unstained Cells 03/24/2018 2  0 - 4 % Final   ??? Macrocytosis 03/24/2018 Marked* Not Present Final   ??? Anisocytosis 03/24/2018 Moderate* Not Present Final   ??? Total Protein 03/24/2018 8.6* 6.5 - 8.3 g/dL Final   ??? Total IgG 53/66/4403 4,363* 600-1,700 mg/dL Final   ??? IgM 47/42/5956 <25* 35 - 290 mg/dL Final   ??? IgA 38/75/6433 9.4* 40.0 - 400.0 mg/dL Final   ??? Smear Review Comments 03/24/2018 See Comment* Undefined Final    Slide reviewed  Myelocytes present-rare.     ??? Rouleaux 03/24/2018 Present* Not Present Final

## 2018-03-24 NOTE — Unmapped (Signed)
Patient labs drawn and sent for analysis. Care per Eppie Gibson RN

## 2018-03-25 NOTE — Unmapped (Signed)
Clinical Pharmacist Practitioner: [facility]       Patient Name: Kayla Torres  Patient Age: 77 y.o.  Encounter Date: 03/24/2018    Initial reason for consult: Chemotherapy Monitoring  Referred by: Porfirio Oar, MD    Chemotherapy regimen: Mel/Ixa/Pred (Haematologica. 2018 Sep;103(9):1518-1526.)    - Melphalan 6 mg/m2 (10mg ) PO daily, days 1-4 Q28D  - Prednisone 60 mg/m2 (100 mg) PO daily, days 1-4 Q28D  - Ixazomib (discontinued from plan 2/2 myelosuppression)    Current place in therapy is C3D1.     Start date: 12/03/17    Subjective/Objective:  Kayla Torres presents to clinic today for chemotherapy monitoring prior to initiation of Mel/Pred (details above) for multiple myeloma. Patient reports that she received her oral chemotherapy from [facility]. Patient's daughter is able to describe accurately the dose and dosing schedule of both Melphalan and Prednisone. Patient is aware that we will be omitting Ixazomib and did not have that chemotherapy refilled by pharmacy. Patient reports intermittent diarrhea, approximately 3 episodes total during this past chemotherapy cycle that she managed appropriately with Loperamide. Patient denies N/V, fevers, chills, SOB, or other signs or symptoms of infection/toxicity.       Assessment:  Mr./[name] is a 77 y.o. female with multiple myeloma who is seen in consultation at the request of Dr. Anise Salvo for chemotherapy monitoring. Patient is tolerating chemotherapy well with mild, intermittent diarrhea managed with Loperamide.      ................    Plan and Recommendations:  1. Multiple Myeloma  - Start C3 Melphalan + Prednisone (details above); Ixazomib discontinued    2. Diarrhea (controlled)  - Continue Loperamide 4 mg PO with first episode and 2 mg PO with each subsequent episode    3. Supportive Care  - Continue Valacyclovir 500 mg PO daily    Follow up:  04/07/18 labs  05/05/18 with [name], NP    I spent 20 minutes in direct patient care.    [place] Silver Achey, PharmD, BCPS, BCOP, CPP  Clinical Pharmacist Practitioner, Myeloma and Lymphoma  Pager: [phone]      History of Present Illness:  We had the pleasure of seeing Kayla Torres in the Myeloma Clinic at the [place] [facility] [place] on 03/24/2018.     His/her oncologic history is as follows:    Oncology History    Diagnosis:  Symptomatic IgG kappa multiple myeloma, 01/11/14.  Kayla Torres was diagnosed with symptomatic myeloma at the age of 77 year old when she presented with symptomatic hypercalcemia.  Evaluation revealed IgG kappa symptomatic multiple myeloma based upon SPEP M-protein of 2.5 g/dL, anemia, hypercalcemia, and lytic lesions documented on skeletal survey. Bone marrow biopsy performed 01/11/14 showed normocellular bone marrow (60%) with involvement by plasma cell dyscrasia (35% plasma cells by aspirate differential, 80% plasma cells by CD138 immunohistochemical analysis of clot, and monotypic kappa by in-situ hybridization).  IgG elevated at 3208 mg/dL, IgM <45, IgA 409.     Stage @ Diagnosis: ISS 2 (B2M 4.11).   ISS I (B2M <3.5 mg/dL and albumin >8.1 g/dL) - Median OS 62 months  ISS II - neither I nor III. - Median OS 44 months  ISS III - B2M >5.5 mg/dL. - Median OS 29 months      Risk Stratification:  High risk based upon +1q  Cytogenetics:    Normal karyotype: 46,XY,inv(9)(p11q13)c[20]   Abnormal FISH: A multiple myeloma FISH panel, on a CD138+ plasma cell-enriched fraction, performed at [facility]) was abnormal.  A percentage of the cells were positive for three 1q signals, monosomy 13, and trisomy 9 and 15. Gain of 1q is associated with a worse prognosis in myeloma patients.     LDH: normal at diagnosis    Treatment History/Disease Course:  1. CyBorD - x 9 cycles. VGPR, best response.  2. Lenalidomide maintenance - 10/2014 - n/v/malaise --> AKI. Subsequently changed to bortezomib q2week maintenance --> PD 07/25/15, with M-spike increased from TLTQ to 2.0 g/dL.    3. Carfilzomib 36mg /m2 + dexamethasone  TTE 07/2015 with normal EF. Addition of revlimid 15 mg 21/28 day cycle on C2. Progression 02/27/16  4. Daratumumab initiated 03/05/16 - PD  5. Cy/Pom/Dex - 04/16/16  6. Benda/Pom/Dex 03/10/17        Bone Health:  - Myeloma survey from 01/09/14: Lateral views of the calvarium show multiple small lytic lesions throughout calvarium. There is diffuse osteopenia throughout the spine. A severe compression fracture at L1 includes mild loss of the posterior vertebral body height but no listhesis. There is also endplate invagination superiorly and inferiorly at L5.  AP views of the appendicular skeleton again show diffuse osteopenia. Lytic lesions are present in the proximal and distal right femur, proximal left femur and left proximal humerus. No pathologic fracture is seen at this time.  - Bisphosphonate:  Received pamidronate in 12/2013 while hospitalized.  Starting monthly zoledronic acid 4mg  IV monthly x 2 years on 02/22/14.    Transplant Status:  Not transplanted; had initial visit with Dr. Lucretia Roers on 04/03/14. Elected to forgo cell collection or transplant.         Multiple myeloma in relapse (CMS-HCC)    01/11/2014 Initial Diagnosis     Multiple myeloma, IgG kappa.  ISS 2.  Presented with hypercalcemia and anemia.  Renal failure corrected after IV hydration.  M-spike 2.5 g/dL         10/25/1094 -  Chemotherapy     CyBorD         02/22/2014 Endocrine/Hormone Therapy     Zometa Therapy 4 mg IV monthly. Plan for two years of therapy         10/27/2014 -  Chemotherapy     Bortezomib maintenance 1.3 mg/m2 [place] q2 weeks         07/25/2015 Progression     M-spike 2.0.  CKD stable, cr 1.45 mg/dL.  Hgb 10.6, slightly reduced from steady-state.         08/08/2015 - 02/27/2016 Chemotherapy     Carfilzomib + dexamethaonse with addition of revlimid 15 mg 21/28 day cycle on C2. 10/03/15 C3 revlimid discontinued after one cycle (C2). C5 delayed a week for URI.         02/17/2016 Progression            02/27/2016 Progression M-spike to 2.3         03/05/2016 -  Chemotherapy     Single agent Daratumumab         04/14/2016 Progression     M-spike 3.3 up from 2.3 on single agent dara         04/15/2016 -  Chemotherapy     Initiated pom/cy/dex; Pom 4mg  d1-21, Cytoxan 400mg  PO days 1, 8, 15, dex 20mg  PO days 1, 8, 15, 22         05/05/2016 -  Chemotherapy     Dose reduced pomalidomide to 2mg  d/t AKI         03/10/2017 -  Chemotherapy     Benda/Pom/Dex  10/09/2017 Progression     M-spike 1.4.  Initiation of treatment delayed by gangrene of right 4th and 5th toes, with hospitalization            Chemotherapy     Ixazomib/Melphalan/Prednisone  M-spike 3.4              MEDICATIONS:  Current Outpatient Medications   Medication Sig Dispense Refill   ??? acetaminophen (TYLENOL) 325 MG tablet Take 2 tablets (650 mg total) by mouth every six (6) hours as needed. 30 tablet 0   ??? alendronate (FOSAMAX) 70 MG tablet Take 1 tablet by mouth once a week. Takes on Tuesday AM     ??? amLODIPine (NORVASC) 5 MG tablet Take 5 mg by mouth daily.      ??? clopidogrel (PLAVIX) 75 mg tablet Take 1 tablet (75 mg total) by mouth daily. 90 tablet 30   ??? colchicine (COLCRYS) 0.6 mg tablet Take 1 tablet (0.6 mg total) by mouth Every Monday, Wednesday, and Friday. 12 tablet 3   ??? ferrous sulfate 325 (65 FE) MG tablet Take 325 mg by mouth daily.     ??? magnesium oxide (MAG-OX) 400 mg (241.3 mg magnesium) tablet Take 1 tablet (400 mg total) by mouth Two (2) times a day. 120 tablet 2   ??? melphalan (ALKERAN) 2 mg tablet Take 5 tabs (10 mg) by mouth daily on days 1-4 of a 28-day cycle. 20 tablet 2   ??? metoprolol tartrate (LOPRESSOR) 25 MG tablet Take 0.5 tablets (12.5 mg total) by mouth Two (2) times a day. 30 tablet 0   ??? ondansetron (ZOFRAN-ODT) 4 MG disintegrating tablet Take 1 tablet (4 mg total) by mouth every eight (8) hours as needed for nausea. 60 tablet 3   ??? levothyroxine (SYNTHROID, LEVOTHROID) 25 MCG tablet Take 1 tablet (25 mcg total) by mouth daily. 30 tablet 1   ??? mirtazapine (REMERON) 45 MG tablet Take 1 tablet (45 mg total) by mouth nightly. 30 tablet 11   ??? oxyCODONE (ROXICODONE) 5 MG immediate release tablet      ??? polyethylene glycol (MIRALAX) 17 gram packet Take 17 g by mouth daily as needed. 10 packet 0   ??? potassium chloride (KLOR-CON) 10 MEQ CR tablet Take 2 tablets (20 mEq total) by mouth daily. 30 tablet 11   ??? predniSONE (DELTASONE) 20 MG tablet Take 1 tablet (20 mg total) by mouth daily. (Patient not taking: Reported on 03/24/2018) 30 tablet 2   ??? predniSONE (DELTASONE) 50 MG tablet Take 2 tablets (100 mg) by mouth daily on days 1-4 of a 28-day cycle 8 tablet 2   ??? senna (SENOKOT) 8.6 mg tablet Take 1 tablet by mouth nightly as needed for constipation. 10 tablet 0   ??? silver sulfaDIAZINE (SILVADENE) 1 % cream Apply topically daily. 100 g 2   ??? traMADol (ULTRAM) 50 mg tablet Take 0.5 tablets (25 mg total) by mouth every eight (8) hours as needed for pain. 60 tablet 0   ??? valACYclovir (VALTREX) 500 MG tablet Take 1 tablet (500 mg total) by mouth daily. 30 tablet 6   ??? zinc gluconate 100 mg Tab Take 100 mg by mouth daily at 0600. 30 each 11     No current facility-administered medications for this visit.         ALLERGIES:  No Known Allergies      LABORATORY DATA:  CMP  Recent Labs     03/24/18  0908   NA 141   K  4.0   CL 107   CO2 25.0   BUN 14   CREATININE 1.00   GLU 107   MG 1.9   CALCIUM 9.1   AST 15   ALT <8*   ALKPHOS 82       Complete Blood Count   Recent Labs     03/24/18  0908   WBC 8.8   RBC 2.96*   HGB 10.3*   HCT 30.9*   MCV 104.2*   MCH 34.6*   MCHC 33.2   PLT 99*   MPV 9.6       Differential (Absolute)   Recent Labs     03/24/18  0908   NEUTROABS 7.9*   LYMPHSABS 0.3*   MONOSABS 0.4   EOSABS 0.0   BASOSABS 0.0

## 2018-03-26 LAB — MYELOMA WORKUP, SERUM
ALBUMIN (SPE): 3.3 g/dL — ABNORMAL LOW (ref 3.5–5.0)
ALPHA-2 GLOBULIN: 0.9 g/dL (ref 0.5–1.1)
BETA-1 GLOBULIN: 0.2 g/dL — ABNORMAL LOW (ref 0.3–0.6)
BETA-2 GLOBULIN: 0.2 g/dL (ref 0.2–0.6)
GAMMAGLOBULIN: 3.5 g/dL — ABNORMAL HIGH (ref 0.5–1.5)
M SPIKE: 3.3 g/dL — ABNORMAL HIGH
PROTEIN TOTAL: 8.6 g/dL — ABNORMAL HIGH (ref 6.5–8.3)

## 2018-03-26 LAB — ALBUMIN (SPE): Lab: 3.3 — ABNORMAL LOW

## 2018-04-07 ENCOUNTER — Encounter: Admit: 2018-04-07 | Discharge: 2018-04-08 | Payer: MEDICARE

## 2018-04-07 ENCOUNTER — Encounter: Admit: 2018-04-07 | Discharge: 2018-04-08 | Payer: MEDICARE | Attending: Adult Health | Primary: Adult Health

## 2018-04-07 ENCOUNTER — Non-Acute Institutional Stay: Admit: 2018-04-07 | Discharge: 2018-04-08 | Payer: MEDICARE

## 2018-04-07 ENCOUNTER — Ambulatory Visit: Admit: 2018-04-07 | Discharge: 2018-04-08 | Payer: MEDICARE

## 2018-04-07 DIAGNOSIS — D61818 Other pancytopenia: Secondary | ICD-10-CM

## 2018-04-07 DIAGNOSIS — C9002 Multiple myeloma in relapse: Principal | ICD-10-CM

## 2018-04-07 LAB — LARGE UNSTAINED CELLS: Lab: 3

## 2018-04-07 LAB — CBC W/ AUTO DIFF
BASOPHILS ABSOLUTE COUNT: 0 10*9/L (ref 0.0–0.1)
BASOPHILS RELATIVE PERCENT: 0.1 %
EOSINOPHILS ABSOLUTE COUNT: 0 10*9/L (ref 0.0–0.4)
EOSINOPHILS RELATIVE PERCENT: 0.4 %
HEMATOCRIT: 23.7 % — ABNORMAL LOW (ref 36.0–46.0)
HEMOGLOBIN: 7.5 g/dL — ABNORMAL LOW (ref 13.5–16.0)
LARGE UNSTAINED CELLS: 3 % (ref 0–4)
LYMPHOCYTES ABSOLUTE COUNT: 0.1 10*9/L — ABNORMAL LOW (ref 1.5–5.0)
LYMPHOCYTES RELATIVE PERCENT: 3 %
MEAN CORPUSCULAR HEMOGLOBIN CONC: 31.8 g/dL (ref 31.0–37.0)
MEAN CORPUSCULAR VOLUME: 110.1 fL — ABNORMAL HIGH (ref 80.0–100.0)
MEAN PLATELET VOLUME: 10.8 fL — ABNORMAL HIGH (ref 7.0–10.0)
MONOCYTES RELATIVE PERCENT: 5 %
NEUTROPHILS ABSOLUTE COUNT: 3 10*9/L (ref 2.0–7.5)
PLATELET COUNT: 24 10*9/L — ABNORMAL LOW (ref 150–440)
RED BLOOD CELL COUNT: 2.15 10*12/L — ABNORMAL LOW (ref 4.00–5.20)
RED CELL DISTRIBUTION WIDTH: 21.2 % — ABNORMAL HIGH (ref 12.0–15.0)
WBC ADJUSTED: 3.4 10*9/L — ABNORMAL LOW (ref 4.5–11.0)

## 2018-04-08 ENCOUNTER — Encounter: Admit: 2018-04-08 | Discharge: 2018-04-09 | Payer: MEDICARE

## 2018-04-08 ENCOUNTER — Other Ambulatory Visit: Admit: 2018-04-08 | Discharge: 2018-04-09 | Payer: MEDICARE

## 2018-04-08 DIAGNOSIS — C9002 Multiple myeloma in relapse: Principal | ICD-10-CM

## 2018-04-08 LAB — CBC W/ AUTO DIFF
BASOPHILS ABSOLUTE COUNT: 0 10*9/L (ref 0.0–0.1)
BASOPHILS RELATIVE PERCENT: 0 %
EOSINOPHILS ABSOLUTE COUNT: 0 10*9/L (ref 0.0–0.4)
EOSINOPHILS RELATIVE PERCENT: 0 %
HEMATOCRIT: 22.8 % — ABNORMAL LOW (ref 36.0–46.0)
HEMOGLOBIN: 7.5 g/dL — ABNORMAL LOW (ref 12.0–16.0)
LARGE UNSTAINED CELLS: 3 % (ref 0–4)
LYMPHOCYTES ABSOLUTE COUNT: 0.1 10*9/L — ABNORMAL LOW (ref 1.5–5.0)
LYMPHOCYTES RELATIVE PERCENT: 2.9 %
MEAN CORPUSCULAR HEMOGLOBIN CONC: 32.7 g/dL (ref 31.0–37.0)
MEAN CORPUSCULAR VOLUME: 105.8 fL — ABNORMAL HIGH (ref 80.0–100.0)
MEAN PLATELET VOLUME: 13 fL — ABNORMAL HIGH (ref 7.0–10.0)
MONOCYTES ABSOLUTE COUNT: 0.1 10*9/L — ABNORMAL LOW (ref 0.2–0.8)
MONOCYTES RELATIVE PERCENT: 3.4 %
NEUTROPHILS ABSOLUTE COUNT: 3.6 10*9/L (ref 2.0–7.5)
NEUTROPHILS RELATIVE PERCENT: 91.2 %
PLATELET COUNT: 20 10*9/L — ABNORMAL LOW (ref 150–440)
RED BLOOD CELL COUNT: 2.15 10*12/L — ABNORMAL LOW (ref 4.00–5.20)
RED CELL DISTRIBUTION WIDTH: 20.2 % — ABNORMAL HIGH (ref 12.0–15.0)
WBC ADJUSTED: 3.9 10*9/L — ABNORMAL LOW (ref 4.5–11.0)

## 2018-04-08 LAB — SLIDE REVIEW

## 2018-04-08 LAB — SMEAR REVIEW

## 2018-04-08 LAB — NEUTROPHIL LEFT SHIFT

## 2018-04-08 NOTE — Unmapped (Signed)
1610: Pt to be transfused 2 units of blood.     Consent for blood products up to date.     Type and cross has been sent. ABO noted in system.     1025: Lab still has type and screen in process.     1036: Called blood bank that reports they are still working on type and screen.     1107: Type and screen completed. Waiting on PRBC's to be prepared.     1120: Pt given tomato soup, crackers, and ginger ale.     1231: Called and spoke to blood bank staff who reported the units should be ready shortly.    1548: Pt tolerated transfusion W/O difficulty. PIV removed per protocol.   Pt left infusion center wheelchair. NAD, no questions nor complaints voiced at D/C. Pt aware of follow up.    Pt given AVS.

## 2018-04-08 NOTE — Unmapped (Signed)
If you feel like you're having an emergency please call 911.  For appointments or questions Monday through Friday 8AM-5PM please call [phone] or Toll Free [phone]. For Medical questions or concerns ask for the Nurse Triage Line.  On Nights, Weekends, and Holidays call [phone] and ask for the Oncologist on Call.  Reasons to call the Nurse Triage Line:  Fever of 100.5 or greater  Nausea and/or vomiting not relived with nausea medicine  Diarrhea or constipation  Severe pain not relieved with usual pain regimen  Shortness of breath  Uncontrolled bleeding  Mental status changes

## 2018-04-08 NOTE — Unmapped (Signed)
1610:  Labs drawn and sent for analysis.  Care provided by  Docia Furl, RN

## 2018-04-12 DIAGNOSIS — M1A0610 Idiopathic chronic gout, right knee, without tophus (tophi): Principal | ICD-10-CM

## 2018-04-12 NOTE — Unmapped (Signed)
Assessment/Plan:   Kayla Torres is a 77 y.o.  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    1. Crystalline arthritis: the patient has crystal proven gout and pseudogout.  She had a steroid injection into her right knee in the hospital early in 2019 but didn't have much improvement with this.  She is currently on colchicine MWF as well as intermittent prednisone dosing for her chemotherapy, which has kept her arthritis under control.  Given that allopurinol alone would not treat her pseudogout, am comfortable with continuing with this regimen for now to minimize pills as long as she is not flaring.  Will check uric acid with next lab check.  Of note, encouraged patient to reach out to heme/onc given low grade fever and to continue to monitor symptoms at home.    Kayla Torres was seen today for follow-up.    Diagnoses and all orders for this visit:    Chronic gout of right knee, unspecified cause  -     Uric acid; Future        RTC in 4-6 months or sooner prn.      Patient was seen and examined with attending physician, Dr. Thomes Lolling, MD  Rheumatology Fellow  Pager [phone]    Primary Care Provider: Leone Payor    CC: right knee pain    HPI:  Kayla Torres is a 77 y.o.  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    As a summary, the patient was first evaluated while inpatient for dry gangrene in January 2019.  After surgery, she developed right knee swelling, pain, tenderness that she states she had not had before.  Second joint aspiration confirmed monosodium urate and CPPD crystals.  She had a steroid injection into the knee once joint cultures were negative.  She was started on colchicine MWF.    She denies any right knee pain or swelling today.  She does have some burning and neuropathy in her feet.  She continues with the colchicine on Monday-Wednesday-Friday.  No swollen or tender joints.  She states that she has been having some weakness; she has not yet started physical therapy.  She states that the chemotherapy is going well.  No history of kidney stones.  She has a low grade fever today but states that she didn't feel ill.  No cough, chest pain, nausea, vomiting, rashes.    Review of Systems:  + as above, otherwise all other systems reviewed are negative.     Medical History:  Problem List Items Addressed This Visit     None      Visit Diagnoses     Chronic gout of right knee, unspecified cause    -  Primary    Relevant Orders    Uric acid          Past Medical History:   Diagnosis Date   ??? Arthritis    ??? High cholesterol    ??? Hypertension    ??? Multiple myeloma (CMS-HCC) 01/18/2014       Allergies:  Patient has no known allergies.    Medications:     Current Outpatient Medications:   ???  acetaminophen (TYLENOL) 325 MG tablet, Take 2 tablets (  650 mg total) by mouth every six (6) hours as needed., Disp: 30 tablet, Rfl: 0  ???  alendronate (FOSAMAX) 70 MG tablet, Take 1 tablet by mouth once a week. Takes on Tuesday AM, Disp: , Rfl:   ???  amLODIPine (NORVASC) 5 MG tablet, Take 5 mg by mouth daily. , Disp: , Rfl:   ???  clopidogrel (PLAVIX) 75 mg tablet, Take 1 tablet (75 mg total) by mouth daily., Disp: 90 tablet, Rfl: 30  ???  colchicine (COLCRYS) 0.6 mg tablet, Take 1 tablet (0.6 mg total) by mouth Every Monday, Wednesday, and Friday., Disp: 12 tablet, Rfl: 3  ???  ferrous sulfate 325 (65 FE) MG tablet, Take 325 mg by mouth daily., Disp: , Rfl:   ???  levothyroxine (SYNTHROID, LEVOTHROID) 25 MCG tablet, Take 1 tablet (25 mcg total) by mouth daily., Disp: 30 tablet, Rfl: 1  ???  magnesium oxide (MAG-OX) 400 mg (241.3 mg magnesium) tablet, Take 1 tablet (400 mg total) by mouth Two (2) times a day., Disp: 120 tablet, Rfl: 2  ???  melphalan (ALKERAN) 2 mg tablet, Take 5 tabs (10 mg) by mouth daily on days 1-4 of a 28-day cycle., Disp: 20 tablet, Rfl: 2  ???  metoprolol tartrate (LOPRESSOR) 25 MG tablet, Take 0.5 tablets (12.5 mg total) by mouth Two (2) times a day., Disp: 30 tablet, Rfl: 0  ???  mirtazapine (REMERON) 45 MG tablet, Take 1 tablet (45 mg total) by mouth nightly., Disp: 30 tablet, Rfl: 11  ???  ondansetron (ZOFRAN-ODT) 4 MG disintegrating tablet, Take 1 tablet (4 mg total) by mouth every eight (8) hours as needed for nausea., Disp: 60 tablet, Rfl: 3  ???  oxyCODONE (ROXICODONE) 5 MG immediate release tablet, , Disp: , Rfl:   ???  polyethylene glycol (MIRALAX) 17 gram packet, Take 17 g by mouth daily as needed., Disp: 10 packet, Rfl: 0  ???  potassium chloride (KLOR-CON) 10 MEQ CR tablet, Take 2 tablets (20 mEq total) by mouth daily., Disp: 30 tablet, Rfl: 11  ???  predniSONE (DELTASONE) 50 MG tablet, Take 2 tablets (100 mg) by mouth daily on days 1-4 of a 28-day cycle, Disp: 8 tablet, Rfl: 2  ???  silver sulfaDIAZINE (SILVADENE) 1 % cream, Apply topically daily., Disp: 100 g, Rfl: 2  ???  traMADol (ULTRAM) 50 mg tablet, Take 0.5 tablets (25 mg total) by mouth every eight (8) hours as needed for pain., Disp: 60 tablet, Rfl: 0  ???  valACYclovir (VALTREX) 500 MG tablet, Take 1 tablet (500 mg total) by mouth daily., Disp: 30 tablet, Rfl: 6  ???  zinc gluconate 100 mg Tab, Take 100 mg by mouth daily at 0600., Disp: 30 each, Rfl: 11  ???  predniSONE (DELTASONE) 20 MG tablet, Take 1 tablet (20 mg total) by mouth daily. (Patient not taking: Reported on 03/24/2018), Disp: 30 tablet, Rfl: 2  ???  senna (SENOKOT) 8.6 mg tablet, Take 1 tablet by mouth nightly as needed for constipation. (Patient not taking: Reported on 04/12/2018), Disp: 10 tablet, Rfl: 0    Surgical History:  Past Surgical History:   Procedure Laterality Date   ??? negative surgical history     ??? PR AMPUTATION TOE,MT-P JT Right 10/24/2017    Procedure: AMPUTATION, TOE; METATARSOPHALANGEAL JOINT;  Surgeon: Boykin Reaper, MD;  Location: MAIN OR [facility];  Service: Vascular       Social History:  Social History     Tobacco Use   ??? Smoking status: Former Smoker  Packs/day: 0.50     Last attempt to quit: 01/17/2014     Years since quitting: 4.2   ??? Smokeless tobacco: Never Used   Substance Use Topics   ??? Alcohol use: No   ??? Drug use: No       Family History:  Family History   Problem Relation Age of Onset   ??? Hypertension Mother    ??? Hypertension Sister    ??? Hypertension Brother        Objective   Vitals:    04/12/18 1239   BP: 147/97   BP Site: L Arm   BP Position: Sitting   BP Cuff Size: Medium   Pulse: 96   Temp: 38 ??C   TempSrc: Oral   Weight: 58.5 kg (129 lb)   Height: 170.2 cm (5' 7.01)       Physical Exam  General: well appearing, no acute distress  Eyes: PERRLA, EOMI, sclera anicteric  ENT: MMM.  Oropharynx without any erythema or exudate.  No oropharyngeal exudates or ulcerations  Neck: supple, no JVD.  No cervical lymphadenopathy  Cardiovascular: Normal Rate, Normal Heart Sounds, Normal Rhythm and No Murmurs  Pulmonary:Normal Breath Sounds Bilaterally and No chest Tenderness  ZO:XWRU, nontender, positive bowel sounds  Skin: no rash, lesions, breakdown. Normal turgor and elasticity.    No Raynaud???s phenomenon or digital ulcers.  Extremities: Warm and well perfused, no cyanosis, clubbing or edema.  Musculoskeletal: No swelling/tenderness/restricted range of motion in small joints of hands, wrists, shoulders, elbows.  Left knee within normal limits.  Right knee with bony prominence although no effusion or warmth, no tenderness.  4th and 5th right toe amputation.  Neurologic: Cranial nerves II-XII grossly intact, strength 5/5 bilaterally.  Normal sensation  Psychiatric: Normal mood and affect.      Test Results  Hospital Outpatient Visit on 04/08/2018   Component Date Value   ??? Crossmatch 04/09/2018 Compatible    ??? Unit Blood Type 04/09/2018 O Pos    ??? ISBT Number 04/09/2018 5100    ??? Unit # 04/09/2018 [id number]    ??? Status 04/09/2018 Transfused    ??? Product ID 04/09/2018 Red Blood Cells    ??? PRODUCT CODE 04/09/2018 [id number]    ??? Crossmatch 04/09/2018 Compatible    ??? Unit Blood Type 04/09/2018 O Pos    ??? ISBT Number 04/09/2018 5100    ??? Unit # 04/09/2018 [id number]    ??? Status 04/09/2018 Transfused    ??? Product ID 04/09/2018 Red Blood Cells    ??? PRODUCT CODE 04/09/2018 [id number]    Lab on 04/08/2018   Component Date Value   ??? Antibody Screen 04/08/2018 NEG    ??? Blood Type 04/08/2018 O POS    ??? WBC 04/08/2018 3.9*   ??? RBC 04/08/2018 2.15*   ??? HGB 04/08/2018 7.5*   ??? HCT 04/08/2018 22.8*   ??? MCV 04/08/2018 105.8*   ??? [facility] 04/08/2018 34.6*   ??? MCHC 04/08/2018 32.7    ??? RDW 04/08/2018 20.2*   ??? MPV 04/08/2018 13.0*   ??? Platelet 04/08/2018 20*   ??? Neutrophils % 04/08/2018 91.2    ??? Lymphocytes % 04/08/2018 2.9    ??? Monocytes % 04/08/2018 3.4    ??? Eosinophils % 04/08/2018 0.0    ??? Basophils % 04/08/2018 0.0    ??? Neutrophil Left Shift 04/08/2018 1+*   ??? Absolute Neutrophils 04/08/2018 3.6    ??? Absolute Lymphocytes 04/08/2018 0.1*   ??? Absolute Monocytes 04/08/2018 0.1*   ???  Absolute Eosinophils 04/08/2018 0.0    ??? Absolute Basophils 04/08/2018 0.0    ??? Large Unstained Cells 04/08/2018 3    ??? Macrocytosis 04/08/2018 Marked*   ??? Anisocytosis 04/08/2018 Moderate*   ??? Hypochromasia 04/08/2018 Slight*   ??? Smear Review Comments 04/08/2018 See Comment*   ??? Rouleaux 04/08/2018 Present*   ??? Dohle Bodies 04/08/2018 Present*   ??? Toxic Granulation 04/08/2018 Present*   Appointment on 04/07/2018   Component Date Value   ??? WBC 04/07/2018 3.4*   ??? RBC 04/07/2018 2.15*   ??? HGB 04/07/2018 7.5*   ??? HCT 04/07/2018 23.7*   ??? MCV 04/07/2018 110.1*   ??? [facility] 04/07/2018 35.1*   ??? MCHC 04/07/2018 31.8    ??? RDW 04/07/2018 21.2*   ??? MPV 04/07/2018 10.8*   ??? Platelet 04/07/2018 24*   ??? Neutrophils % 04/07/2018 88.2    ??? Lymphocytes % 04/07/2018 3.0    ??? Monocytes % 04/07/2018 5.0    ??? Eosinophils % 04/07/2018 0.4    ??? Basophils % 04/07/2018 0.1 ??? Neutrophil Left Shift 04/07/2018 2+*   ??? Absolute Neutrophils 04/07/2018 3.0    ??? Absolute Lymphocytes 04/07/2018 0.1*   ??? Absolute Monocytes 04/07/2018 0.2    ??? Absolute Eosinophils 04/07/2018 0.0    ??? Absolute Basophils 04/07/2018 0.0    ??? Large Unstained Cells 04/07/2018 3    ??? Macrocytosis 04/07/2018 Marked*   ??? Anisocytosis 04/07/2018 Moderate*   ??? Hypochromasia 04/07/2018 Moderate*   Office Visit on 03/24/2018   Component Date Value   ??? Free T4 03/24/2018 0.70*   Lab on 03/24/2018   Component Date Value   ??? Sodium 03/24/2018 141    ??? Potassium 03/24/2018 4.0    ??? Chloride 03/24/2018 107    ??? CO2 03/24/2018 25.0    ??? BUN 03/24/2018 14    ??? Creatinine 03/24/2018 1.00    ??? BUN/Creatinine Ratio 03/24/2018 14    ??? EGFR MDRD Non Af Amer 03/24/2018 54*   ??? EGFR MDRD Af Amer 03/24/2018 >=60    ??? Anion Gap 03/24/2018 9    ??? Glucose 03/24/2018 107    ??? Calcium 03/24/2018 9.1    ??? Albumin 03/24/2018 3.6    ??? Total Protein 03/24/2018 8.9*   ??? Total Bilirubin 03/24/2018 0.4    ??? AST 03/24/2018 15    ??? ALT 03/24/2018 <8*   ??? Alkaline Phosphatase 03/24/2018 82    ??? Kappa Free, Serum 03/24/2018 66.25*   ??? Lambda Free, Serum 03/24/2018 <0.25*   ??? K/L FLC Ratio 03/24/2018     ??? Magnesium 03/24/2018 1.9    ??? WBC 03/24/2018 8.8    ??? RBC 03/24/2018 2.96*   ??? HGB 03/24/2018 10.3*   ??? HCT 03/24/2018 30.9*   ??? MCV 03/24/2018 104.2*   ??? [facility] 03/24/2018 34.6*   ??? MCHC 03/24/2018 33.2    ??? RDW 03/24/2018 21.0*   ??? MPV 03/24/2018 9.6    ??? Platelet 03/24/2018 99*   ??? Neutrophils % 03/24/2018 90.4    ??? Lymphocytes % 03/24/2018 3.0    ??? Monocytes % 03/24/2018 4.4    ??? Eosinophils % 03/24/2018 0.3    ??? Basophils % 03/24/2018 0.5    ??? Absolute Neutrophils 03/24/2018 7.9*   ??? Absolute Lymphocytes 03/24/2018 0.3*   ??? Absolute Monocytes 03/24/2018 0.4    ??? Absolute Eosinophils 03/24/2018 0.0    ??? Absolute Basophils 03/24/2018 0.0    ??? Large Unstained Cells 03/24/2018 2    ??? Macrocytosis  03/24/2018 Marked*   ??? Anisocytosis 03/24/2018 Moderate* ??? ALBUMIN (SPE) 03/24/2018 3.3*   ??? ALPHA-1 GLOBULIN 03/24/2018 0.5    ??? ALPHA-2 GLOBULIN 03/24/2018 0.9    ??? BETA-1 GLOBULIN 03/24/2018 0.2*   ??? BETA-2 GLOBULIN 03/24/2018 0.2    ??? GAMMAGLOBULIN 03/24/2018 3.5*   ??? M Spike 03/24/2018 3.3*   ??? SPE Interpretation 03/24/2018     ??? Immunofixation Electroph* 03/24/2018     ??? Total Protein 03/24/2018 8.6*   ??? Total Protein 03/24/2018 8.6*   ??? Total IgG 03/24/2018 4,363*   ??? IgM 03/24/2018 <25*   ??? IgA 03/24/2018 9.4*   ??? Smear Review Comments 03/24/2018 See Comment*   ??? Rouleaux 03/24/2018 Present*

## 2018-04-12 NOTE — Unmapped (Signed)
It was a pleasure to see you today!  If there are any questions or concerns after you leave the office, you can send me a mychart message or call the clinic at [phone].  It could take up to three weeks for all of the results to be back.    Plan for today:  I will see you back in 4-6 months     If you have a flare before your next visit, give [place] a call    Please call your oncologist about your fever

## 2018-04-14 DIAGNOSIS — C9002 Multiple myeloma in relapse: Principal | ICD-10-CM

## 2018-04-14 LAB — CBC W/ AUTO DIFF
BASOPHILS ABSOLUTE COUNT: 0 10*9/L (ref 0.0–0.1)
BASOPHILS RELATIVE PERCENT: 0.4 %
HEMATOCRIT: 31.5 % — ABNORMAL LOW (ref 36.0–46.0)
HEMOGLOBIN: 10.1 g/dL — ABNORMAL LOW (ref 13.5–16.0)
LARGE UNSTAINED CELLS: 5 % — ABNORMAL HIGH (ref 0–4)
LYMPHOCYTES ABSOLUTE COUNT: 0.1 10*9/L — ABNORMAL LOW (ref 1.5–5.0)
LYMPHOCYTES RELATIVE PERCENT: 1.8 %
MEAN CORPUSCULAR HEMOGLOBIN CONC: 32 g/dL (ref 31.0–37.0)
MEAN CORPUSCULAR HEMOGLOBIN: 32.5 pg (ref 26.0–34.0)
MEAN CORPUSCULAR VOLUME: 101.6 fL — ABNORMAL HIGH (ref 80.0–100.0)
MEAN PLATELET VOLUME: 7.9 fL (ref 7.0–10.0)
MONOCYTES ABSOLUTE COUNT: 0.1 10*9/L — ABNORMAL LOW (ref 0.2–0.8)
MONOCYTES RELATIVE PERCENT: 2.6 %
NEUTROPHILS ABSOLUTE COUNT: 4.3 10*9/L (ref 2.0–7.5)
NEUTROPHILS RELATIVE PERCENT: 89.6 %
PLATELET COUNT: 10 10*9/L — ABNORMAL LOW (ref 150–440)
RED BLOOD CELL COUNT: 3.1 10*12/L — ABNORMAL LOW (ref 4.00–5.20)
RED CELL DISTRIBUTION WIDTH: 21.5 % — ABNORMAL HIGH (ref 12.0–15.0)
WBC ADJUSTED: 4.8 10*9/L (ref 4.5–11.0)

## 2018-04-14 LAB — BASOPHILS RELATIVE PERCENT: Lab: 0.4

## 2018-04-14 NOTE — Unmapped (Signed)
Labs drawn via newly place PIV.     brisk blood return     Pt tolerated well.    Results reviewed - WNL. No complaints voiced    Interventions - no intervention needed.    Pt stable and assisted from clinic via wheelchair by family.

## 2018-04-15 ENCOUNTER — Ambulatory Visit
Admit: 2018-04-15 | Discharge: 2018-04-21 | Disposition: A | Discharge status: Skilled Nursing Facility | Payer: MEDICARE | Admitting: Hematology & Oncology

## 2018-04-15 ENCOUNTER — Ambulatory Visit
Admit: 2018-04-15 | Discharge: 2018-04-21 | Disposition: A | Discharge status: Skilled Nursing Facility | Payer: MEDICARE | Attending: Rheumatology | Admitting: Hematology & Oncology

## 2018-04-15 DIAGNOSIS — R531 Weakness: Principal | ICD-10-CM

## 2018-04-15 LAB — ANION GAP: Anion gap 3:SCnc:Pt:Ser/Plas:Qn:: 6 — ABNORMAL LOW

## 2018-04-15 LAB — BILIRUBIN UA: Lab: NEGATIVE

## 2018-04-15 LAB — BLOOD GAS CRITICAL CARE PANEL, VENOUS
BASE EXCESS VENOUS: -0.8 (ref -2.0–2.0)
GLUCOSE WHOLE BLOOD: 94 mg/dL
HCO3 VENOUS: 23 mmol/L (ref 22–27)
LACTATE BLOOD VENOUS: 1.5 mmol/L (ref 0.5–1.8)
PCO2 VENOUS: 36 mmHg — ABNORMAL LOW (ref 40–60)
PH VENOUS: 7.42 (ref 7.32–7.43)
PO2 VENOUS: 25 mmHg — ABNORMAL LOW (ref 30–55)
POTASSIUM WHOLE BLOOD: 3.6 mmol/L (ref 3.4–4.6)
SODIUM WHOLE BLOOD: 138 mmol/L (ref 135–145)

## 2018-04-15 LAB — MANUAL DIFFERENTIAL
LYMPHOCYTES - ABS (DIFF): 0.1 10*9/L — ABNORMAL LOW (ref 1.5–5.0)
LYMPHOCYTES - REL (DIFF): 3 %
MONOCYTES - ABS (DIFF): 0.1 10*9/L — ABNORMAL LOW (ref 0.2–0.8)
MONOCYTES - REL (DIFF): 5 %
NEUTROPHILS - ABS (DIFF): 2 10*9/L (ref 2.0–7.5)
NEUTROPHILS - REL (DIFF): 92 %

## 2018-04-15 LAB — BASOPHILS - ABS (DIFF): Lab: 0

## 2018-04-15 LAB — URINALYSIS WITH CULTURE REFLEX
GLUCOSE UA: NEGATIVE
HYALINE CASTS: 3 /LPF — ABNORMAL HIGH (ref 0–1)
KETONES UA: NEGATIVE
LEUKOCYTE ESTERASE UA: NEGATIVE
NITRITE UA: NEGATIVE
PH UA: 7 (ref 5.0–9.0)
PROTEIN UA: 100 — AB
RBC UA: 4 /HPF (ref <=4)
SPECIFIC GRAVITY UA: 1.014 (ref 1.003–1.030)
SQUAMOUS EPITHELIAL: 5 /HPF (ref 0–5)
UROBILINOGEN UA: 0.2
WBC UA: 3 /HPF (ref 0–5)

## 2018-04-15 LAB — COMPREHENSIVE METABOLIC PANEL
ALBUMIN: 2.6 g/dL — ABNORMAL LOW (ref 3.5–5.0)
ALKALINE PHOSPHATASE: 74 U/L (ref 38–126)
ALT (SGPT): 10 U/L — ABNORMAL LOW (ref 15–48)
ANION GAP: 6 mmol/L — ABNORMAL LOW (ref 9–15)
AST (SGOT): 19 U/L (ref 14–38)
BILIRUBIN TOTAL: 0.7 mg/dL (ref 0.0–1.2)
BLOOD UREA NITROGEN: 18 mg/dL (ref 7–21)
BUN / CREAT RATIO: 13
CALCIUM: 8.2 mg/dL — ABNORMAL LOW (ref 8.5–10.2)
CO2: 23 mmol/L (ref 22.0–30.0)
CREATININE: 1.37 mg/dL — ABNORMAL HIGH (ref 0.60–1.00)
EGFR CKD-EPI AA FEMALE: 43 mL/min/{1.73_m2} — ABNORMAL LOW (ref >=60)
EGFR CKD-EPI NON-AA FEMALE: 37 mL/min/{1.73_m2} — ABNORMAL LOW (ref >=60)
GLUCOSE RANDOM: 99 mg/dL (ref 65–179)
POTASSIUM: 4.3 mmol/L (ref 3.5–5.0)
SODIUM: 138 mmol/L (ref 135–145)

## 2018-04-15 LAB — MEAN CORPUSCULAR VOLUME: Lab: 100.6 — ABNORMAL HIGH

## 2018-04-15 LAB — CBC W/ AUTO DIFF
HEMATOCRIT: 29.9 % — ABNORMAL LOW (ref 36.0–46.0)
HEMOGLOBIN: 9.5 g/dL — ABNORMAL LOW (ref 12.0–16.0)
MEAN CORPUSCULAR HEMOGLOBIN CONC: 31.8 g/dL (ref 31.0–37.0)
MEAN CORPUSCULAR VOLUME: 100.6 fL — ABNORMAL HIGH (ref 80.0–100.0)
MEAN PLATELET VOLUME: 7.8 fL (ref 7.0–10.0)
PLATELET COUNT: 10 10*9/L — ABNORMAL LOW (ref 150–440)
RED BLOOD CELL COUNT: 2.97 10*12/L — ABNORMAL LOW (ref 4.00–5.20)
RED CELL DISTRIBUTION WIDTH: 20.7 % — ABNORMAL HIGH (ref 12.0–15.0)

## 2018-04-15 LAB — TROPONIN I
TROPONIN I: <0.034 ng/mL (ref <0.034)
Troponin I.cardiac:MCnc:Pt:Ser/Plas:Qn:: <0.034

## 2018-04-15 LAB — BASE EXCESS VENOUS: Base excess:SCnc:Pt:BldV:Qn:Calculated: -0.8

## 2018-04-15 LAB — D-DIMER QUANTITATIVE (CH,ML,PD,ET): Lab: 12091 — ABNORMAL HIGH

## 2018-04-15 LAB — PRO-BNP: Natriuretic peptide.B prohormone N-Terminal:MCnc:Pt:Ser/Plas:Qn:: 3270 — ABNORMAL HIGH

## 2018-04-15 NOTE — Unmapped (Signed)
Patient rounds completed. The following patient needs were addressed:  Pain, Toileting, Personal Belongings, Plan of Care, Call Bell in Reach and Bed Position Low .

## 2018-04-15 NOTE — Unmapped (Signed)
[facility] Emergency Department Provider Note    ED Clinical Impression     Final diagnoses:   Weakness (Primary)       ED Assessment/Plan and ED Course      Patient was seen by me at 2:29 PM.    Patient is a 77 y.o. female with a PMH of HTN, arthritis, HOLD, multiple myeloma, CKD, thrombocytopenia, pancytopenia, gangrene of foot who presents to the ED with c/o weakness that has been ongoing for x1 day. Per daughter who is at bedside, patient was seen for routine lab monitoring yesterday in clinic. Weekly labs monitored given hx of multiple myeloma. Was told yesterday patient would not need a transfusion and instructed to return tomorrow (Friday) for re-draw. Per family, today patient seemed more fatigued and less ambulatory than normal. They were concerned that she has been feeling more weak. They report noticing some mild SOB and coughing. Pt also confirms the coughing, but denies fevers, N/V/D, CP, HA, dizziness. Family also states decreased appetite, but pt has been trying to stay hydrated. No known recent sick contacts.     On exam, patient is non-toxic appearing and in no acute distress at this time. She does appears fatigued. Skin is warm and dry. LS clear, equal, unlabored bilaterally throughout. HR mildly tachy. Remaining PE otherwise unremarkable.       Will obtain CBC, CMP, type & screen, troponin, UA. Will obtain CXR and EKG.     3:29 PM EKG reviewed. Sinus tach. 113bpm. No ST abnormalities noted. Previous obtained in 10/20/2017 noted to have incomplete RBBB, no RBBB noted today.    4:34 PM CXR resulted. Labs pending.   IMPRESSION:   -Left apical consolidative opacity is concerning for pneumonia. Recommend continued radiologic follow-up to clearance.  -Diffuse increased pulmonary opacities, likely background pulmonary edema although multifocal infection is in the differential.     5:00 PM Patient's presentation and treatment plan discussed and signed out to ED APP Harle Stanford, [place]. Labs pending. Previous chart, nursing notes, and vital signs reviewed.      Pertinent labs & imaging results that were available during my care of the patient were reviewed by me and considered in my medical decision making (see chart for details).     History     Chief Complaint    Weakness    HPI     Kayla Torres is a 77 y.o. female. Pt presents with c/o weakness. Onset of symptoms was today while doing nothing and is moderate, constant and gradual in nature. Describes symptoms as vague. Associated symptoms include SOB and cough. Worsens with none. Relieved by none. Denies nausea, vomiting, diarrhea, CP, HA, dizziness/lightheadedness and fevers. Pt with a hx of HTN, arthritis, HOLD, multiple myeloma, CKD, thrombocytopenia, pancytopenia, gangrene of foot. History obtained from patient.     Past Medical History:   Diagnosis Date   ??? Arthritis    ??? High cholesterol    ??? Hypertension    ??? Multiple myeloma (CMS-HCC) 01/18/2014       Past Surgical History:   Procedure Laterality Date   ??? negative surgical history     ??? PR AMPUTATION TOE,MT-P JT Right 10/24/2017    Procedure: AMPUTATION, TOE; METATARSOPHALANGEAL JOINT;  Surgeon: Boykin Reaper, MD;  Location: MAIN OR [facility];  Service: Vascular       No current facility-administered medications for this encounter.     Current Outpatient Medications:   ???  acetaminophen (TYLENOL) 325 MG tablet, Take 2 tablets (650  mg total) by mouth every six (6) hours as needed., Disp: 30 tablet, Rfl: 0  ???  alendronate (FOSAMAX) 70 MG tablet, Take 1 tablet by mouth once a week. Takes on Tuesday AM, Disp: , Rfl:   ???  amLODIPine (NORVASC) 5 MG tablet, Take 5 mg by mouth daily. , Disp: , Rfl:   ???  clopidogrel (PLAVIX) 75 mg tablet, Take 1 tablet (75 mg total) by mouth daily., Disp: 90 tablet, Rfl: 30  ???  colchicine (COLCRYS) 0.6 mg tablet, Take 1 tablet (0.6 mg total) by mouth Every Monday, Wednesday, and Friday., Disp: 12 tablet, Rfl: 3  ???  ferrous sulfate 325 (65 FE) MG tablet, Take 325 mg by mouth daily., Disp: , Rfl:   ???  levothyroxine (SYNTHROID, LEVOTHROID) 25 MCG tablet, Take 1 tablet (25 mcg total) by mouth daily., Disp: 30 tablet, Rfl: 1  ???  magnesium oxide (MAG-OX) 400 mg (241.3 mg magnesium) tablet, Take 1 tablet (400 mg total) by mouth Two (2) times a day., Disp: 120 tablet, Rfl: 2  ???  melphalan (ALKERAN) 2 mg tablet, Take 5 tabs (10 mg) by mouth daily on days 1-4 of a 28-day cycle., Disp: 20 tablet, Rfl: 2  ???  metoprolol tartrate (LOPRESSOR) 25 MG tablet, Take 0.5 tablets (12.5 mg total) by mouth Two (2) times a day., Disp: 30 tablet, Rfl: 0  ???  mirtazapine (REMERON) 45 MG tablet, Take 1 tablet (45 mg total) by mouth nightly., Disp: 30 tablet, Rfl: 11  ???  ondansetron (ZOFRAN-ODT) 4 MG disintegrating tablet, Take 1 tablet (4 mg total) by mouth every eight (8) hours as needed for nausea., Disp: 60 tablet, Rfl: 3  ???  oxyCODONE (ROXICODONE) 5 MG immediate release tablet, , Disp: , Rfl:   ???  polyethylene glycol (MIRALAX) 17 gram packet, Take 17 g by mouth daily as needed., Disp: 10 packet, Rfl: 0  ???  potassium chloride (KLOR-CON) 10 MEQ CR tablet, Take 2 tablets (20 mEq total) by mouth daily., Disp: 30 tablet, Rfl: 11  ???  predniSONE (DELTASONE) 20 MG tablet, Take 1 tablet (20 mg total) by mouth daily. (Patient not taking: Reported on 03/24/2018), Disp: 30 tablet, Rfl: 2  ???  predniSONE (DELTASONE) 50 MG tablet, Take 2 tablets (100 mg) by mouth daily on days 1-4 of a 28-day cycle, Disp: 8 tablet, Rfl: 2  ???  senna (SENOKOT) 8.6 mg tablet, Take 1 tablet by mouth nightly as needed for constipation. (Patient not taking: Reported on 04/12/2018), Disp: 10 tablet, Rfl: 0  ???  silver sulfaDIAZINE (SILVADENE) 1 % cream, Apply topically daily., Disp: 100 g, Rfl: 2  ???  traMADol (ULTRAM) 50 mg tablet, Take 0.5 tablets (25 mg total) by mouth every eight (8) hours as needed for pain., Disp: 60 tablet, Rfl: 0  ???  valACYclovir (VALTREX) 500 MG tablet, Take 1 tablet (500 mg total) by mouth daily., Disp: 30 tablet, Rfl: 6  ??? zinc gluconate 100 mg Tab, Take 100 mg by mouth daily at 0600., Disp: 30 each, Rfl: 11    Allergies  Patient has no known allergies.    Family History   Problem Relation Age of Onset   ??? Hypertension Mother    ??? Hypertension Sister    ??? Hypertension Brother        Social History  Social History     Tobacco Use   ??? Smoking status: Former Smoker     Packs/day: 0.50     Last attempt to quit: 01/17/2014  Years since quitting: 4.2   ??? Smokeless tobacco: Never Used   Substance Use Topics   ??? Alcohol use: No   ??? Drug use: No       Review of Systems  Review of Systems   Constitutional: Negative for fever.   HENT: Negative for sore throat.    Eyes: Negative for pain.   Respiratory: Negative for shortness of breath.    Cardiovascular: Negative for chest pain.   Gastrointestinal: Negative for abdominal pain.   Genitourinary: Negative for hematuria.   Skin: Negative for rash.   Neurological: Positive for weakness. Negative for headaches.   All other systems reviewed and are negative.      Physical Exam     Vital Signs  BP 115/74  - Pulse 117  - Temp 36.8 ??C (98.3 ??F) (Skin)  - Resp 22  - SpO2 95%     Physical Exam  Physical Exam   Constitutional: She is oriented to person, place, and time. She appears well-developed.   HENT:   Head: Atraumatic.   Nose: Nose normal.   Mouth/Throat: Oropharynx is clear and moist.   Eyes: Pupils are equal, round, and reactive to light. Conjunctivae are normal. Right eye exhibits no discharge. Left eye exhibits no discharge.   Neck: Normal range of motion. Neck supple. No tracheal deviation present.   Cardiovascular: Regular rhythm and normal heart sounds.   No murmur heard.  Mildly tachy   Pulmonary/Chest: Effort normal and breath sounds normal. No respiratory distress. She has no wheezes.   Abdominal: Soft. Bowel sounds are normal. She exhibits no distension and no mass. There is no tenderness.   Musculoskeletal: Normal range of motion. She exhibits no edema.   Neurological: She is alert and oriented to person, place, and time. No cranial nerve deficit.   Skin: Skin is warm and dry. Capillary refill takes less than 2 seconds.   Psychiatric: She has a normal mood and affect. Her behavior is normal.        Loel Ro, NP  04/15/18 1720

## 2018-04-15 NOTE — Unmapped (Signed)
Per pt daughter she has been drowsy for the last few days, today she had increased weakness and was not able to leave her bed.

## 2018-04-15 NOTE — Unmapped (Signed)
Patient presents to the ED for weakness and had blood work done to check Hgb yesterday in case of need for blood transfusion. No transfusion was needed. Plt level was low on blood work. Patient is not eating and cannot walk.

## 2018-04-16 LAB — CBC
HEMATOCRIT: 26.2 % — ABNORMAL LOW (ref 36.0–46.0)
HEMOGLOBIN: 8.3 g/dL — ABNORMAL LOW (ref 12.0–16.0)
HEMOGLOBIN: 7.7 g/dL — ABNORMAL LOW (ref 12.0–16.0)
MEAN CORPUSCULAR HEMOGLOBIN CONC: 31.7 g/dL (ref 31.0–37.0)
MEAN CORPUSCULAR HEMOGLOBIN CONC: 31.3 g/dL (ref 31.0–37.0)
MEAN CORPUSCULAR HEMOGLOBIN: 32.5 pg (ref 26.0–34.0)
MEAN CORPUSCULAR HEMOGLOBIN: 32.1 pg (ref 26.0–34.0)
MEAN CORPUSCULAR VOLUME: 102.5 fL — ABNORMAL HIGH (ref 80.0–100.0)
MEAN CORPUSCULAR VOLUME: 102.6 fL — ABNORMAL HIGH (ref 80.0–100.0)
MEAN PLATELET VOLUME: 8.3 fL (ref 7.0–10.0)
MEAN PLATELET VOLUME: 8.5 fL (ref 7.0–10.0)
PLATELET COUNT: 56 10*9/L — ABNORMAL LOW (ref 150–440)
RED BLOOD CELL COUNT: 2.56 10*12/L — ABNORMAL LOW (ref 4.00–5.20)
RED BLOOD CELL COUNT: 2.38 10*12/L — ABNORMAL LOW (ref 4.00–5.20)
RED CELL DISTRIBUTION WIDTH: 20.6 % — ABNORMAL HIGH (ref 12.0–15.0)
RED CELL DISTRIBUTION WIDTH: 20.9 % — ABNORMAL HIGH (ref 12.0–15.0)
WBC ADJUSTED: 1.4 10*9/L — ABNORMAL LOW (ref 4.5–11.0)
WBC ADJUSTED: 1.2 10*9/L — ABNORMAL LOW (ref 4.5–11.0)

## 2018-04-16 LAB — CBC W/ AUTO DIFF
BASOPHILS ABSOLUTE COUNT: 0 10*9/L (ref 0.0–0.1)
BASOPHILS RELATIVE PERCENT: 0.8 %
EOSINOPHILS ABSOLUTE COUNT: 0 10*9/L (ref 0.0–0.4)
EOSINOPHILS RELATIVE PERCENT: 0.8 %
HEMATOCRIT: 24.5 % — ABNORMAL LOW (ref 36.0–46.0)
HEMOGLOBIN: 8 g/dL — ABNORMAL LOW (ref 12.0–16.0)
LARGE UNSTAINED CELLS: 8 % — ABNORMAL HIGH (ref 0–4)
LYMPHOCYTES RELATIVE PERCENT: 2.9 %
MEAN CORPUSCULAR HEMOGLOBIN CONC: 32.7 g/dL (ref 31.0–37.0)
MEAN CORPUSCULAR HEMOGLOBIN: 33.1 pg (ref 26.0–34.0)
MEAN PLATELET VOLUME: 8.4 fL (ref 7.0–10.0)
MONOCYTES ABSOLUTE COUNT: 0.1 10*9/L — ABNORMAL LOW (ref 0.2–0.8)
MONOCYTES RELATIVE PERCENT: 4.9 %
NEUTROPHILS ABSOLUTE COUNT: 1.1 10*9/L — ABNORMAL LOW (ref 2.0–7.5)
NEUTROPHILS RELATIVE PERCENT: 82.3 %
PLATELET COUNT: 9 10*9/L — CL (ref 150–440)
RED BLOOD CELL COUNT: 2.42 10*12/L — ABNORMAL LOW (ref 4.00–5.20)
RED CELL DISTRIBUTION WIDTH: 20.9 % — ABNORMAL HIGH (ref 12.0–15.0)
WBC ADJUSTED: 1.3 10*9/L — ABNORMAL LOW (ref 4.5–11.0)

## 2018-04-16 LAB — TROPONIN I: Troponin I.cardiac:MCnc:Pt:Ser/Plas:Qn:: <0.034

## 2018-04-16 LAB — PERIPHERAL BLOOD SMEAR, PATH REVIEW

## 2018-04-16 LAB — BUN / CREAT RATIO: Urea nitrogen/Creatinine:MRto:Pt:Ser/Plas:Qn:: 14

## 2018-04-16 LAB — SMEAR REVIEW

## 2018-04-16 LAB — THYROID STIMULATING HORMONE: Thyrotropin:ACnc:Pt:Ser/Plas:Qn:: 1.079

## 2018-04-16 LAB — SLIDE REVIEW

## 2018-04-16 LAB — BASIC METABOLIC PANEL
ANION GAP: 7 mmol/L — ABNORMAL LOW (ref 9–15)
BLOOD UREA NITROGEN: 17 mg/dL (ref 7–21)
BUN / CREAT RATIO: 14
CALCIUM: 7.8 mg/dL — ABNORMAL LOW (ref 8.5–10.2)
CHLORIDE: 113 mmol/L — ABNORMAL HIGH (ref 98–107)
CREATININE: 1.24 mg/dL — ABNORMAL HIGH (ref 0.60–1.00)
EGFR CKD-EPI AA FEMALE: 49 mL/min/{1.73_m2} — ABNORMAL LOW (ref >=60)
EGFR CKD-EPI NON-AA FEMALE: 42 mL/min/{1.73_m2} — ABNORMAL LOW (ref >=60)
GLUCOSE RANDOM: 94 mg/dL (ref 65–99)
POTASSIUM: 3.3 mmol/L — ABNORMAL LOW (ref 3.5–5.0)
SODIUM: 137 mmol/L (ref 135–145)

## 2018-04-16 LAB — LACTATE BLOOD VENOUS: Lactate:SCnc:Pt:BldV:Qn:: 1

## 2018-04-16 LAB — PATHOLOGIST SMEAR INTERPRETATION

## 2018-04-16 LAB — MEAN CORPUSCULAR HEMOGLOBIN CONC: Lab: 31.3

## 2018-04-16 LAB — HEMATOCRIT: Lab: 24.5 — ABNORMAL LOW

## 2018-04-16 LAB — PLATELET COUNT: Lab: 8 — CL

## 2018-04-16 NOTE — Unmapped (Signed)
Patient rounds completed. The following patient needs were addressed:  Pain, Toileting, Personal Belongings, Plan of Care, Call Bell in Reach and Bed Position Low .

## 2018-04-16 NOTE — Unmapped (Addendum)
Patient rounds completed. The following patient needs were addressed:  Pain, Toileting, Positioning;   SUPINE, Personal Belongings, Plan of Care, Call Bell in Reach and Bed Position Low . Patient appears sleeping soundly at the current time. Full patient re-assessment limited at this time in order to promote patient rest and healing. Resps appear easy and unlabored although tachpneic and shallow. 0 s/s of pain or distress noted at this time. Writer will monitor for change in status and complete full assessment when patient awake or additional care required. Patient maintained on full continuous cardiac/POx monitor. Bed locked and in lowest position. Bed rail x2 up. Patient belongings retained at bedside by patient and within reach. Call bell in reach. Nursing will continue to monitor, complete all orders and provide comfort as able. Safety precautions and fall prevention maintained per protocol. 0 visitors at bedside at this time.

## 2018-04-16 NOTE — Unmapped (Signed)
Report received from off going RN, [employer]. Care assumed. Patient lying in bed quietly with eyes closed. 0 patient complaint or s/s of distress noted. Patient maintained on full continuous cardiac/POx monitor. VSS. Bed locked and in lowest position. Bed rail x2 up. Patient belongings retained at bedside by patient and within reach. Call bell in reach. Nursing will continue to monitor, complete all orders and provide comfort as able. Safety precautions and fall prevention maintained per protocol. 0 visitors at bedside at this time.

## 2018-04-16 NOTE — Unmapped (Signed)
Report given to receiving RN, Sarita Haver. RN made aware all patient status, treatment, tests and monitoring completed, meds adm and patient care needs.

## 2018-04-16 NOTE — Unmapped (Signed)
Patient rounds completed. The following patient needs were addressed:  Pain, Toileting, Positioning;   SUPINE, Personal Belongings, Plan of Care, Call Bell in Reach and Bed Position Low . Writer to bedside for re-assessment, BC and repeat labs. IV remains positional and not completed 1830. IV placed on IV pump. Patient continues to deny pain. Adm MD at bedside for patient assessment. Writer attempt x2 for lab draw with 0 success. Additional Rn consulted for U/S lab draw. MD aware of same. Abx to start when [facility] obtained. Patient assisted with incont care and repositioning for comfort. Patient continues to deny pain. 0 skin breakdown noted or reported. Patient maintained on full continuous cardiac/POx monitor. VSS. Bed locked and in lowest position. Bed rail x2 up. Patient belongings retained at bedside by patient and within reach. Call bell in reach. Nursing will continue to monitor, complete all orders and provide comfort as able. Safety precautions and fall prevention maintained per protocol. Patient's daughter remains at bedside at this time.

## 2018-04-16 NOTE — Unmapped (Signed)
Pt laying in stretcher with eyes closed. Appears to be sleeping. Resp even and unlabored. Equal rise and fall of chest. Skin is warm and dry.  Call bell within reach, bed locked and in lowest position, patient belongings at bedside and within reach. Pt updated on plan of care.

## 2018-04-16 NOTE — Unmapped (Signed)
Med E1 History and Physical    Assessment/Plan:    Active Problems:    Hypertension    Multiple myeloma in relapse (CMS-HCC)    CKD (chronic kidney disease) stage 3, GFR 30-59 ml/min (CMS-HCC)    Pancytopenia (CMS-HCC)      Kayla Torres is a 77 y.o. female with PMHx significant for multiple myeloma, CKD, HTN, crystalline arthropathy, and hypothyroidism that presented to [facility] with weakness.     Pneumonia: Presented with weakness, chills and intermittent cough, on evaluation in the ED was hemodynamically stable without hypoxemia but tachycardic with HR 110-120s and febrile to 38.1C. CXR showed left apical consolidative opacity concerning for infection. Received ~3L IVF without significant improvement in tachycardia, but lactate normal and other vital signs reassuring. S/p empiric coverage with vancomycin and cefepime in the ED. Given normal ANC and relative lack of risk factors for exposure to MDR organisms, suspect CAP.    - IV ceftriaxone and azithromycin   - Consider LRCx if cough becomes productive   - F/u blood cultures sent in ED      Failure to thrive: Progressive decompensation appreciated by daughter at bedside with poor appetite, decreased desire to get out of bed or participate in previously enjoyed activities, and progressive weakness. More acute worsening recently, likely multifactorial in the setting of chemotherapy which daughter appreciates as an inciting event as well as infection as above. Given this decline, daughter would like the team to clarify things like goals of care and code status with the patient in the morning when more interactive.   - Consider palliative care involvement   - Daily weights  - Encourage PO intake     Elevated D-dimer: D-dimer elevated at 12,000, obtained in the ED 2/2 tachycardia, underlying malignancy, and inability to obtain CTA due to underlying CKD, although no complaints of dyspnea and no hypoxemia. Given asymptomatic and normal troponin, low suspicion for PE at this time so will defer initiation of anticoagulation.   - PVL lower extremities  - Low threshold to start anticoagulation of clinically decompensates     IgG kappa multiple myeloma: Follows with Synetta Fail and Dr. Anise Salvo in Myeloma clinic. Initially diagnosed in 12/2013 in the setting of worsening back pain. Initially treated with CyBorD, but unfortunately treatment has been complicated by progression on multiple regimens, including bortezomib maintenance, carfilzomib/dex/revlimid, single agent daratumumab, and Benda/Pom/Dex . Most recent treatment has included ixazomib/melphalan/prednisone but she experienced significant pancytopenias with Ixazomib and current plan is to proceed with melphalan and prednisone alone.   - Continue prednisone 20mg  daily   - Continue calcium and vitamin D supplementation   - Continue valacyclovir 500mg  daily for VZV PPx     Stage III CKD: Creatinine widely variable in the past year, but average baseline appears to be ~1.5. On presentation, renal function at baseline with Cr. 1.3.   - Avoid nephrotoxic agents as able     Crystalline arthritis: Crystal proven gout and pseudogout, follows with rheumatology. Currently without knee pain, edema or erythema in joints.  - Continue home colchicine MWF and prednisone as above     HTN: Hold metoprolol in setting of infection   Hypothyroidism: Recently started on synthroid daily, will continue   ___________________________________________________________________    Chief Complaint:  Chief Complaint   Patient presents with   ??? Weakness     <principal problem not specified>    HPI:  Kayla Torres is a 77 y.o. female with PMHx significant for multiple myeloma, CKD, HTN,  crystalline arthropathy, and hypothyroidism that presented to [facility] with weakness.  History largely obtained from daughter at bedside, with intermittent additional details provided by patient.  They report gradual deterioration in functional status over the past 4 to 6 months, with increasingly poor appetite, progressive weight loss, global weakness, and disengagement with care.  At the beginning of this time interval, she was doing most of her own grocery shopping, leaving the house to participate in activities with friends, and was very engaged in medical care although she did rely on family for rides to and from these events.  More recently, she has started using a walker for assistance with ambulation due to weakness but was getting out of bed and moving around the house fairly independently.  Daughter does report more rapid worsening of these symptoms since her most recent treatment for multiple myeloma at the end of May. Over the last 1 to 2 weeks however, her weakness has become more pronounced and she has largely only gotten out of bed to the bedside commode.  She has been refusing meals, eating only a few bites intermittently throughout the day, and not maintaining fluid intake.  Family has been staying with her 24 hours at home to attempt assistance, but she largely refuses their help and prefers to stay in bed.  Daughter also reports weight loss of about 5 to 10 pounds over the last month despite efforts to increase p.o. Intake.    Today while at work, her daughter received a phone call from other family members overseeing care that Kayla Torres was looking more fatigued and weak than normal.  They had not noticed any changes from her more recent behavior, and denied any complaints voiced throughout the day, but due to concern for deterioration brought her to the ED.  During my interview, both daughter and patient deny any focal complaints.  She denies confusion, shortness of breath, chest pain, abdominal pain, nausea or vomiting, constipation or diarrhea, dysuria.  Daughter does report hearing intermittent cough over the last 48 hours, patient reports nonproductive and self resolving with drinks of water.  Daughter denies any concern for aspiration or coughing after eating/drinking. Denies fevers, but did report intermittent shaking chills since arrival to the ED.    On arrival to the ED, tachycardic with heart rate 110-120s, was initially afebrile but then developed temperature 38.1C.  Blood pressure and oxygen saturation was normal.  Initial evaluation significant for leukopenia with WBC 2.2 but ANC 2.0, UA without evidence of infection, but CXR showed LUL consolidation concerning for infection prompting admission.       Oncology History    Diagnosis:  Symptomatic IgG kappa multiple myeloma, 01/11/14.  Ms. Dudzinski was diagnosed with symptomatic myeloma at the age of 77 year old when she presented with symptomatic hypercalcemia.  Evaluation revealed IgG kappa symptomatic multiple myeloma based upon SPEP M-protein of 2.5 g/dL, anemia, hypercalcemia, and lytic lesions documented on skeletal survey. Bone marrow biopsy performed 01/11/14 showed normocellular bone marrow (60%) with involvement by plasma cell dyscrasia (35% plasma cells by aspirate differential, 80% plasma cells by CD138 immunohistochemical analysis of clot, and monotypic kappa by in-situ hybridization).  IgG elevated at 3208 mg/dL, IgM <45, IgA 409.     Stage @ Diagnosis: ISS 2 (B2M 4.11).   ISS I (B2M <3.5 mg/dL and albumin >8.1 g/dL) - Median OS 62 months  ISS II - neither I nor III. - Median OS 44 months  ISS III - B2M >5.5 mg/dL. - Median OS 29 months  Risk Stratification:  High risk based upon +1q  Cytogenetics:    Normal karyotype: 46,XY,inv(9)(p11q13)c[20]   Abnormal FISH: A multiple myeloma FISH panel, on a CD138+ plasma cell-enriched fraction, performed at [facility]) was abnormal. A percentage of the cells were positive for three 1q signals, monosomy 13, and trisomy 9 and 15. Gain of 1q is associated with a worse prognosis in myeloma patients.     LDH: normal at diagnosis    Treatment History/Disease Course:  1. CyBorD - x 9 cycles. VGPR, best response.  2. Lenalidomide maintenance - 10/2014 - n/v/malaise --> AKI. Subsequently changed to bortezomib q2week maintenance --> PD 07/25/15, with M-spike increased from TLTQ to 2.0 g/dL.    3. Carfilzomib 36mg /m2 + dexamethasone  TTE 07/2015 with normal EF. Addition of revlimid 15 mg 21/28 day cycle on C2. Progression 02/27/16  4. Daratumumab initiated 03/05/16 - PD  5. Cy/Pom/Dex - 04/16/16  6. Benda/Pom/Dex 03/10/17        Bone Health:  - Myeloma survey from 01/09/14: Lateral views of the calvarium show multiple small lytic lesions throughout calvarium. There is diffuse osteopenia throughout the spine. A severe compression fracture at L1 includes mild loss of the posterior vertebral body height but no listhesis. There is also endplate invagination superiorly and inferiorly at L5.  AP views of the appendicular skeleton again show diffuse osteopenia. Lytic lesions are present in the proximal and distal right femur, proximal left femur and left proximal humerus. No pathologic fracture is seen at this time.  - Bisphosphonate:  Received pamidronate in 12/2013 while hospitalized.  Starting monthly zoledronic acid 4mg  IV monthly x 2 years on 02/22/14.    Transplant Status:  Not transplanted; had initial visit with Dr. Lucretia Roers on 04/03/14. Elected to forgo cell collection or transplant.         Multiple myeloma in relapse (CMS-HCC)    01/11/2014 Initial Diagnosis     Multiple myeloma, IgG kappa.  ISS 2.  Presented with hypercalcemia and anemia.  Renal failure corrected after IV hydration.  M-spike 2.5 g/dL         10/25/1094 -  Chemotherapy     CyBorD         02/22/2014 Endocrine/Hormone Therapy     Zometa Therapy 4 mg IV monthly. Plan for two years of therapy         10/27/2014 -  Chemotherapy     Bortezomib maintenance 1.3 mg/m2 [place] q2 weeks         07/25/2015 Progression     M-spike 2.0.  CKD stable, cr 1.45 mg/dL.  Hgb 10.6, slightly reduced from steady-state.         08/08/2015 - 02/27/2016 Chemotherapy     Carfilzomib + dexamethaonse with addition of revlimid 15 mg 21/28 day cycle on C2. 10/03/15 C3 revlimid discontinued after one cycle (C2). C5 delayed a week for URI.         02/17/2016 Progression            02/27/2016 Progression     M-spike to 2.3         03/05/2016 -  Chemotherapy     Single agent Daratumumab         04/14/2016 Progression     M-spike 3.3 up from 2.3 on single agent dara         04/15/2016 -  Chemotherapy     Initiated pom/cy/dex; Pom 4mg  d1-21, Cytoxan 400mg  PO days 1, 8, 15, dex 20mg  PO days 1,  8, 15, 22         05/05/2016 -  Chemotherapy     Dose reduced pomalidomide to 2mg  d/t AKI         03/10/2017 -  Chemotherapy     Benda/Pom/Dex         10/09/2017 Progression     M-spike 1.4.  Initiation of treatment delayed by gangrene of right 4th and 5th toes, with hospitalization            Chemotherapy     Ixazomib/Melphalan/Prednisone  M-spike 3.4              Allergies:  Patient has no known allergies.    Medications:   Prior to Admission medications    Medication Dose, Route, Frequency   acetaminophen (TYLENOL) 325 MG tablet 650 mg, Oral, Every 6 hours PRN   alendronate (FOSAMAX) 70 MG tablet 1 tablet, Oral, Weekly, Takes on Tuesday AM   amLODIPine (NORVASC) 5 MG tablet 5 mg, Oral, Daily (standard)   clopidogrel (PLAVIX) 75 mg tablet 75 mg, Oral, Daily (standard)   colchicine (COLCRYS) 0.6 mg tablet 0.6 mg, Oral, Every Mon-Wed-Fri   ferrous sulfate 325 (65 FE) MG tablet 325 mg, Oral, Daily   levothyroxine (SYNTHROID, LEVOTHROID) 25 MCG tablet 25 mcg, Oral, Daily (standard)   magnesium oxide (MAG-OX) 400 mg (241.3 mg magnesium) tablet 1 tablet, Oral, 2 times a day   melphalan (ALKERAN) 2 mg tablet Take 5 tabs (10 mg) by mouth daily on days 1-4 of a 28-day cycle.   metoprolol tartrate (LOPRESSOR) 25 MG tablet 12.5 mg, Oral, 2 times a day (standard)   mirtazapine (REMERON) 45 MG tablet 45 mg, Oral, Nightly   ondansetron (ZOFRAN-ODT) 4 MG disintegrating tablet 4 mg, Oral, Every 8 hours PRN   oxyCODONE (ROXICODONE) 5 MG immediate release tablet No dose, route, or frequency recorded.   polyethylene glycol (MIRALAX) 17 gram packet 17 g, Oral, Daily PRN   potassium chloride (KLOR-CON) 10 MEQ CR tablet 20 mEq, Oral, Daily (standard)   predniSONE (DELTASONE) 20 MG tablet 20 mg, Oral, Daily (standard)   predniSONE (DELTASONE) 50 MG tablet Take 2 tablets (100 mg) by mouth daily on days 1-4 of a 28-day cycle   senna (SENOKOT) 8.6 mg tablet 1 tablet, Oral, Nightly PRN   silver sulfaDIAZINE (SILVADENE) 1 % cream Topical, Daily (standard)   traMADol (ULTRAM) 50 mg tablet 25 mg, Oral, Every 8 hours PRN   valACYclovir (VALTREX) 500 MG tablet 500 mg, Oral, Daily (standard)   zinc gluconate 100 mg Tab 100 mg, Oral, daily       Medical History:  Past Medical History:   Diagnosis Date   ??? Arthritis    ??? High cholesterol    ??? Hypertension    ??? Multiple myeloma (CMS-HCC) 01/18/2014       Surgical History:  Past Surgical History:   Procedure Laterality Date   ??? negative surgical history     ??? PR AMPUTATION TOE,MT-P JT Right 10/24/2017    Procedure: AMPUTATION, TOE; METATARSOPHALANGEAL JOINT;  Surgeon: Boykin Reaper, MD;  Location: MAIN OR [facility];  Service: Vascular       Social History:  Social History     Socioeconomic History   ??? Marital status: Divorced     Spouse name: Not on file   ??? Number of children: Not on file   ??? Years of education: Not on file   ??? Highest education level: Not on file   Occupational History   ???  Not on file   Social Needs   ??? Financial resource strain: Not on file   ??? Food insecurity:     Worry: Not on file     Inability: Not on file   ??? Transportation needs:     Medical: Not on file     Non-medical: Not on file   Tobacco Use   ??? Smoking status: Former Smoker     Packs/day: 0.50     Last attempt to quit: 01/17/2014     Years since quitting: 4.2   ??? Smokeless tobacco: Never Used   Substance and Sexual Activity   ??? Alcohol use: No   ??? Drug use: No   ??? Sexual activity: Never     Birth control/protection: Post-menopausal   Lifestyle   ??? Physical activity:     Days per week: Not on file     Minutes per session: Not on file   ??? Stress: Not on file   Relationships   ??? Social connections:     Talks on phone: Not on file     Gets together: Not on file     Attends religious service: Not on file     Active member of club or organization: Not on file     Attends meetings of clubs or organizations: Not on file     Relationship status: Not on file   Other Topics Concern   ??? Not on file   Social History Narrative   ??? Not on file       Family History:  Family History   Problem Relation Age of Onset   ??? Hypertension Mother    ??? Hypertension Sister    ??? Hypertension Brother        Review of Systems:  10 systems reviewed and are negative unless otherwise mentioned in HPI    Labs/Studies:  Labs and Studies from the last 24hrs per EMR and Reviewed    Physical Exam:  Temp:  [36.2 ??C-38.7 ??C] 38.7 ??C  Heart Rate:  [108-127] 127  SpO2 Pulse:  [115] 115  Resp:  [16-35] 35  BP: (115-177)/(62-94) 123/63  SpO2:  [92 %-98 %] 95 %    GEN: Chronically ill and uncomfortable appearing, fatigued but interactive with prompting, NAD lying in bed  EYES: EOMI  ENT: dry mucous membranes, dentures in place   CV: tachycardic but regular rhythm, no murmurs appreciated  PULM: patient minimally participatory with poor inspiratory effort, but grossly CTAB with no noted rhonchi, crackles or wheezing   ABD: soft, NT/ND, +BS  EXT: No edema, sequelae of previous toe amputations   NEURO: No focal deficits  PSYCH: A+Ox3, appropriate  GU: No CVA tenderness  MSK: No spinal tenderness    Theresia Bough, MD  Internal Medicine PGY-2  04/16/18 12:55 AM

## 2018-04-16 NOTE — Unmapped (Addendum)
ED Progress Note    Assumed care of this patient at 5 PM.  Labs pending.  In summary, the patient is septic and is being admitted for pneumonia.  She is neutropenic, febrile with thrombocytopenia and AKI.    Medical history of hypertension, arthritis, atrial LD, multiple myeloma, CKD, thrombocytopenia, pancytopenia, gangrene of foot who presented to the emergency department with complaint of weakness in the setting of having been evaluated for weakness by primary care provider with hemoglobin checked yesterday and noted to be:  Hemoglobin 10.1/hematocrit 31.5 and platelet count 10.    Patient presents to the emergency department today with ongoing and slight worsening of weakness and poor appetite poor appetite.    8:10 PM  Evaluation of patient at this time I note that she is lying on her right side and appears acutely ill.  She has shaking chills and a cough.  Patient notes sore throat today.    I note on the monitor her heart rate is 127.    10:03 PM  proBNP 3270 without prior for comparison.    HEENT~ Remarkable only for a little dry and pharynx with mild diffuse erythema without edema or exudate.  No tonsillar asymmetry.  Throat culture pending.  Heart~tachycardic with otherwise regular rate and rhythm S1-S2   Lungs~transient rhonchi at left mid lung field.   Abdomen~normoactive bowel sounds in all 4 quadrants, soft and I do not appreciate any hepatosplenomegaly or tenderness to palpation    10:35 PM  Patient has mild shaking chills.  She has received fluid bolus.  Vital signs at this time??? heart rate 120  Respiratory rate 24  O2 sat 94 to 95% on room air  Blood pressure 157/94  Await admission heme-onc.  The patient has received cefepime and vancomycin has been ordered.      Xr Chest 2 Views    Result Date: 04/15/2018  EXAM: CHEST TWO VIEW DATE: 04/15/2018 4:18 PM ACCESSION: [id number] UN DICTATED: 04/15/2018 4:24 PM INTERPRETATION LOCATION: [facility] CLINICAL INDICATION: 77 years old Female with SHORTNESS OF BREATH  COMPARISON: 04/27/2016 TECHNIQUE:PA/Upright and lateral views of the chest. FINDINGS: Left apical consolidative opacity is concerning for pneumonia. Diffuse hazy pulmonary opacities. No pneumothorax or pleural effusion. Tortuous thoracic aorta, aortic knob is obscured by left upper lobe consolidated opacity. L1 vertebral body collapse is chronic. Osteopenia.     - Left apical consolidative opacity is concerning for pneumonia. Recommend continued radiologic follow-up to clearance. Diffuse increased pulmonary opacities, likely background pulmonary edema although multifocal infection is in the differential.    Addendum 2:02 AM  Patient is awaiting bed.  She is resting comfortably and I note  HR 103  RR 20  O2 sat 96% RA  BP 113/63    Rapid strep screen is negative.  D-dimer 12,091  2:25 AM~spoke with hospitalist team who recommended d-dimer be obtained.  Dr. Burnett Corrente states she does not feel that CTA chest with contrast to evaluate for PE is necessary at this time given patient's presentation.  As noted by Dr. Allyne Gee, patient has a number of other reasons to have an elevated d-dimer.    As noted above, patient is resting comfortably.  I did order repeat troponin at this time.  Ongoing care by hospitalist team and end of my shift (~3:00AM) I will sign this patient out to Patient's presentation and treatment plan discussed and signed out to ED Attending MD Lenon Ahmadi.

## 2018-04-17 LAB — CBC W/ AUTO DIFF
BASOPHILS RELATIVE PERCENT: 1.1 %
EOSINOPHILS ABSOLUTE COUNT: 0 10*9/L (ref 0.0–0.4)
EOSINOPHILS RELATIVE PERCENT: 0.5 %
HEMATOCRIT: 24.2 % — ABNORMAL LOW (ref 36.0–46.0)
HEMOGLOBIN: 7.5 g/dL — ABNORMAL LOW (ref 12.0–16.0)
LARGE UNSTAINED CELLS: 11 % — ABNORMAL HIGH (ref 0–4)
LYMPHOCYTES ABSOLUTE COUNT: 0.1 10*9/L — ABNORMAL LOW (ref 1.5–5.0)
LYMPHOCYTES RELATIVE PERCENT: 6.2 %
MEAN CORPUSCULAR HEMOGLOBIN CONC: 31.2 g/dL (ref 31.0–37.0)
MEAN CORPUSCULAR VOLUME: 102.3 fL — ABNORMAL HIGH (ref 80.0–100.0)
MEAN PLATELET VOLUME: 9.8 fL (ref 7.0–10.0)
MONOCYTES ABSOLUTE COUNT: 0 10*9/L — ABNORMAL LOW (ref 0.2–0.8)
MONOCYTES RELATIVE PERCENT: 4.1 %
NEUTROPHILS ABSOLUTE COUNT: 0.9 10*9/L — ABNORMAL LOW (ref 2.0–7.5)
NEUTROPHILS RELATIVE PERCENT: 77.2 %
PLATELET COUNT: 33 10*9/L — ABNORMAL LOW (ref 150–440)
RED BLOOD CELL COUNT: 2.36 10*12/L — ABNORMAL LOW (ref 4.00–5.20)
RED CELL DISTRIBUTION WIDTH: 20.7 % — ABNORMAL HIGH (ref 12.0–15.0)
WBC ADJUSTED: 1.1 10*9/L — ABNORMAL LOW (ref 4.5–11.0)

## 2018-04-17 LAB — SMEAR REVIEW

## 2018-04-17 LAB — BASIC METABOLIC PANEL
ANION GAP: 4 mmol/L — ABNORMAL LOW (ref 9–15)
BLOOD UREA NITROGEN: 22 mg/dL — ABNORMAL HIGH (ref 7–21)
BUN / CREAT RATIO: 18
CALCIUM: 7.8 mg/dL — ABNORMAL LOW (ref 8.5–10.2)
CHLORIDE: 114 mmol/L — ABNORMAL HIGH (ref 98–107)
CO2: 20 mmol/L — ABNORMAL LOW (ref 22.0–30.0)
CREATININE: 1.22 mg/dL — ABNORMAL HIGH (ref 0.60–1.00)
GLUCOSE RANDOM: 65 mg/dL (ref 65–179)
POTASSIUM: 3.7 mmol/L (ref 3.5–5.0)
SODIUM: 138 mmol/L (ref 135–145)

## 2018-04-17 LAB — CREATININE: Creatinine:MCnc:Pt:Ser/Plas:Qn:: 1.22 — ABNORMAL HIGH

## 2018-04-17 LAB — VITAMIN B-12: Cobalamins:MCnc:Pt:Ser/Plas:Qn:: >1000 — ABNORMAL HIGH

## 2018-04-17 LAB — HOMOCYSTEINE: HOMOCYSTEINE PLASMA: 10.6 umol/L (ref 5.0–13.0)

## 2018-04-17 LAB — MYELOMA SERUM CHEMISTRIES
GAMMAGLOBULIN; IGM: <25 mg/dL — ABNORMAL LOW (ref 35–290)
PROTEIN TOTAL: 6.8 g/dL (ref 6.5–8.3)

## 2018-04-17 LAB — CREATINE KINASE TOTAL: Creatine kinase:CCnc:Pt:Ser/Plas:Qn:: <20 — ABNORMAL LOW

## 2018-04-17 LAB — WBC ADJUSTED: Lab: 1.1 — ABNORMAL LOW

## 2018-04-17 LAB — HOMOCYSTEINE PLASMA: Homocysteine:SCnc:Pt:Ser/Plas:Qn:: 10.6

## 2018-04-17 LAB — GAMMAGLOBULIN; IGM: IgM:MCnc:Pt:Ser/Plas:Qn:: <25 — ABNORMAL LOW

## 2018-04-17 LAB — GAMMAGLOBULIN; IGG: IgG:MCnc:Pt:Ser/Plas:Qn:: 4018 — ABNORMAL HIGH

## 2018-04-17 NOTE — Unmapped (Signed)
AOx4, VSS, remained afebrile and free from falls. Pt refusing meds. Pt c/o max 7/10 pain once and PRN tramadol given once.Pt had episodes of incontinence. Refusing to let staff clean up even with education. Will continue to monitor.

## 2018-04-17 NOTE — Unmapped (Signed)
PHYSICAL THERAPY  Evaluation (04/17/18 1045)     Patient Name:  Kayla Torres       Medical Record Number: [id number]   Date of Birth: [birth year]  Sex: Female            Treatment Diagnosis: deconditioning, generalized muscle weakness     ASSESSMENT    Kayla Torres is a 77 y.o. female who presented to [facility] feeling weaker with progressive loss of weight and appetite and was found to have LUL consolidation concerning for CAP and she was started on Ceftrixone/Azithromycin. She was diagnosed in 12/2013 with multiple myeloma and has been through multiple regiments with more recent Ixazomib/melphalan/prednisone. Her weakness and muscle wasting is significant in her lower extremities. Her history of chemotherapy, current presumed CAP, and MM could all contribute to lack of po intake that may be responsible for her sarcopenia. However, other causes are being looked into such as myopathy, vitamin deficiency, or another neurologic source. Patient presenting with generalized weakness and deconditioning and would greatly benefit from continued PT to maximize functional mobility. Based on the [facility] score of 14/24, patient is considered 53.86% impaired with mobility; therefore, recommend PT 5x/week (low intensity) post-discharge to address mobility deficits.     Today's Interventions: AMPAC 14/24; sit to and from standing transfers with mod A and cues for hand/foot placement, sequencing, posture, balance and eccentric control; gait training with RW with min A and cues for RW management, posture, step sequencing, posture; educated patient in supine SLR, ankle pumps, and heel slides and wrote exercises on white board in room; educated in the acute PT role/POC and the importance of OOB to chair daily with nursing staff     Activity Tolerance: Patient tolerated treatment well    PLAN  Planned Frequency of Treatment:  1-2x per day for: 3-4x week      Planned Interventions: Transfer training;Therapeutic activity;Endurance activities;Gait training;Home exercise program;Stair training;Education - Family / caregiver;Education - Patient;Balance activities;Self-care / Home training;Therapeutic exercise    Post-Discharge Physical Therapy Recommendations:  5x weekly;Low intensity        PT DME Recommendations: Defer to post acute     Goals:   Patient and Family Goals: none stated     Long Term Goal #1: pt will transfer and ambulate household distances mod indep with appropriate AD in 6 weeks        SHORT GOAL #1: pt will perform all functional transfers with supervision and appropriate AD               Time Frame : 2 weeks  SHORT GOAL #2: pt will ambulate x200 feet with supervision and appropriate AD               Time Frame : 2 weeks  SHORT GOAL #3: pt will negotiate 5 stairs with railing and supervision               Time Frame : 2 weeks                                        Prognosis:  Fair  Barriers to Discharge: Functional strength deficits;Endurance deficits;Gait instability;Inaccessible home environment;Impaired balance       SUBJECTIVE  Patient reports: pt in agreement with PT session   Current Functional Status: pt received supine with HOB elevated on 4ONC, RN (Amie) cleared session, pt seated in bedside recliner chair with  BLE elevated, all needs in reach and CNA present in room, RN aware of patient's status and will continue to monitor   Services patient receives: PT;OT  Prior functional status: pt states that she has been using a RW to ambulate recently; pt states that she remains mostly in her bed during the day due to feeling too weak to get up and move; denies falls   Equipment available at home: [employer];Shower chair    Past Medical History:   Diagnosis Date   ??? Arthritis    ??? Chronic kidney disease    ??? High cholesterol    ??? Hypertension    ??? Multiple myeloma (CMS-HCC) 01/18/2014    Social History     Tobacco Use   ??? Smoking status: Former Smoker     Packs/day: 0.50     Last attempt to quit: 01/17/2014     Years since quitting: 4.2 ??? Smokeless tobacco: Never Used   Substance Use Topics   ??? Alcohol use: No      Past Surgical History:   Procedure Laterality Date   ??? negative surgical history     ??? PR AMPUTATION TOE,MT-P JT Right 10/24/2017    Procedure: AMPUTATION, TOE; METATARSOPHALANGEAL JOINT;  Surgeon: Boykin Reaper, MD;  Location: MAIN OR [facility];  Service: Vascular    Family History   Problem Relation Age of Onset   ??? Hypertension Mother    ??? Hypertension Sister    ??? Hypertension Brother         Allergies: Patient has no known allergies.                Objective Findings              Precautions: Falls;Neutropenic precautions              Weight Bearing Status: Non- applicable              Required Braces or Orthoses: Non- applicable    Communication Preference: Verbal  Pain Comments: denies pain   Medical Tests / Procedures: reviewed in EPIC   Equipment / Environment: Vascular access (PIV, TLC, Port-a-cath, PICC)    At Rest: VSS per EPIC   With Activity: VSS/NAD  Orthostatics: asymptomatic throughout session        Living environment: House  Lives With: Alone(per chart review, family has been providing increased assistance for mobility and ADLs recently )  Home Living: Multi-level home;Stairs to enter with rails;Stairs to alternate level with rails;Tub/shower unit        Number of Stairs: (patient has multiple sets of 4-5 steps with railings due to a split level home set up )    Cognition: Able to answer questions and follow commands appropriately             UE Strength: patient unable to raise RUE to shoulder level (patient reports It's been that way for a while now.)      LE Strength: generalized weakness and deconditioning                           Balance: sitting balance EOB with supervision; standing balance with min A and support of RW    Posture: forward flexed posture      Bed Mobility: supine to sitting with min A with HOB elevated and use of bed railing; patient able to scoot/weight shift and reposition hips once in sitting   Transfers: sit to and from  standing with mod A and RW (patient with the most difficulty with toilet transfer due to lower surface height)   Gait: ambulation x60 feet with min A and RW; slightly unsteady but no overt loss of balance; forward flexed posture with short step length and height    Stairs: not addressed this session due to weakness and fatigue            Eval Duration(PT): 40 Min.          I attest that I have reviewed the above information.  Signed: Felipa Emory, PT  Filed 04/17/2018

## 2018-04-17 NOTE — Unmapped (Signed)
Pt arrived to unit from ED. Pt AAO x4, afebrile and denies pain. Oriented pt to room, call bell and POC. Pt verbalized understanding. Will continue to monitor pt.

## 2018-04-17 NOTE — Unmapped (Signed)
Care Management  Initial Transition Planning Assessment    Per H&P:  Kayla Torres is a 77 y.o. female with PMHx significant for multiple myeloma, CKD, HTN, crystalline arthropathy, and hypothyroidism that presented to [facility] with weakness.              General  Care Manager assessed the patient by : In person interview with patient, In person interview with family  Orientation Level: Disoriented to time  Who provides care at home?: N/A     Type of Residence: Mailing Address:  [address]  [place] [address]  Contacts: Accompanied by: Family member  Patient Phone Number: [phone] (home)         Medical Provider(s): VICENTE T FALGUI  Reason for Admission: Admitting Diagnosis:  Weakness [R53.1]  Thrombocytopenia (CMS-HCC) [D69.6]  AKI (acute kidney injury) (CMS-HCC) [N17.9]  Sepsis, due to unspecified organism (CMS-HCC) [A41.9]  Neutropenia, unspecified type (CMS-HCC) [D70.9]  Pneumonia of left lung due to infectious organism, unspecified part of lung [J18.9]  Past Medical History:   has a past medical history of Arthritis, Chronic kidney disease, High cholesterol, Hypertension, and Multiple myeloma (CMS-HCC) (01/18/2014).  Past Surgical History:   has a past surgical history that includes negative surgical history and pr amputation toe,mt-p jt (Right, 10/24/2017).   Previous admit date: 10/20/2017    Primary Insurance- Payor: [employer] MEDICARE ADV / Plan: UNITED HEALTHCARE MEDICARE ADV / Product Type: *No Product type* /   Secondary Insurance ??? Secondary Insurance  MEDICAID [place]  Prescription Coverage ??? yes  Preferred Pharmacy - [facility] - [place], [place] - [address]    Transportation home: Private vehicle  Level of function prior to admission: Requires Assistance    Contact/Decision Maker        Extended Emergency Contact Information  Primary Emergency Contact: [place]  Home Phone: [phone]  Relation: Daughter    Legal Next of Kin / Guardian / POA / Advance Directives       Advance Directive (Medical Treatment)  Does patient have an advance directive covering medical treatment?: Patient does not have advance directive covering medical treatment., Patient would not like information.  Advance directive covering medical treatment not in Chart:: Copy requested from family  Reason patient does not have an advance directive covering medical treatment:: Patient does not wish to complete one at this time  Reason there is not a Health Care Decision Maker appointed:: Patient does not wish to appoint a Health Care Decision Maker at this time  Information provided on advance directive:: No  Patient requests assistance:: No    Advance Directive (Mental Health Treatment)  Does patient have an advance directive covering mental health treatment?: Patient does not have advance directive covering mental health treatment., Patient would not like information.  Reason patient does not have an advance directive covering mental health treatment:: Patient does not wish to complete one at this time.    Patient Information  Lives with: Alone    Type of Residence: Private residence        Location/Detail: [facility], 5 steps to enter home    Support Systems: Children, Family Members    Responsibilities/Dependents at home?: No    Home Care services in place prior to admission?: No    Equipment Currently Used at Home: wheelchair, manual, walker, rolling       Currently receiving outpatient dialysis?: No       Financial Information  Need for financial assistance?: No       Social Determinants of Health  Social Determinants of Health were addressed in provider documentation.  Please refer to patient history.    Discharge Needs Assessment  Concerns to be Addressed: discharge planning    Clinical Risk Factors: > 65, Principal Diagnosis: Cancer, Stroke, COPD, Heart Failure, AMI, Pneumonia, Joint Replacment    Barriers to taking medications: No    Prior overnight hospital stay or ED visit in last 90 days: No    Readmission Within the Last 30 Days: no previous admission in last 30 days         Anticipated Changes Related to Illness: none    Equipment Needed After Discharge: (S) other (see comments)(TBD)    Discharge Facility/Level of Care Needs: other (see comments)(home with home health, not [facility], vs SNF)    Readmission  Risk of Unplanned Readmission Score: UNPLANNED READMISSION SCORE: 26%  Readmitted Within the Last 30 Days?   Patient at risk for readmission?: Yes    Discharge Plan  Screen findings are: Care Manager reviewed the plan of the patient's care with the Multidisciplinary Team. No discharge planning needs identified at this time. Care Manager will continue to manage plan and monitor patient's progress with the team.    Expected Discharge Date: 04/20/18    Expected Transfer from Critical Care:      Patient and/or family were provided with choice of facilities / services that are available and appropriate to meet post hospital care needs?: Yes   List choices in order highest to lowest preferred, if applicable. : Pt does not want HH with [facility] or SNF with [facility]    Initial Assessment complete?: Yes

## 2018-04-17 NOTE — Unmapped (Addendum)
Kayla Torres is a 77 y.o. female with PMHx significant for multiple myeloma, CKD, HTN, crystalline arthropathy, and hypothyroidism that presented to [facility] with weakness, weight loss, and dec    Pneumonia: Presented with weakness, chills and intermittent cough, on evaluation in the ED was hemodynamically stable without hypoxemia but tachycardic with HR 110-120s and febrile to 38.1C. CXR showed left apical consolidative opacity concerning for infection. Received ~3L IVF without significant improvement in tachycardia, but other vital signs and lactate within normal. Blood cultures have not grown anything. Started on Ceftriaxone and Azithromycin for CAP coverage (6/28-7/2).  Bedside POCUS demonstrated findings consistent with upper lobe consolidation. Has been afebrile during admission.     Failure to thrive: Progressive decompensation appreciated by daughter at bedside with poor appetite, weight loss, decreased desire to get out of bed or participate in previously enjoyed activities, and progressive weakness. More acute worsening recently, likely multifactorial in the setting of chemotherapy and progressive multiple myeloma which daughter appreciates as an inciting event as well as infection as above. Workup for vitamin deficiency and myopathy has remained negative, thyroid and adrenal function intact.   ??  Elevated D-dimer: D-dimer elevated at 12,000, obtained in the ED 2/2 tachycardia, underlying malignancy, and did not obtain CTA due to underlying CKD, although no complaints of dyspnea and no hypoxemia.   ??  IgG kappa multiple myeloma: Follows with Kayla Torres and Kayla Torres in Myeloma clinic. Initially diagnosed in 12/2013 in the setting of worsening back pain. Initially treated with CyBorD, but unfortunately treatment has been complicated by progression on multiple regimens, including bortezomib maintenance, carfilzomib/dex/revlimid, single agent daratumumab, and Benda/Pom/Dex . Most recent treatment has included ixazomib/melphalan/prednisone. While in the hospital, she was given prednisone 20 mg/daily and Valacyclovir 500 mg/daily have been continued She started on a prednisone taper of 10 mg daily from 7/2-7/9 followed by 5 mg daily 7/9-7/16. Total IgG of 3944, IgM of 25, IgA of 10.9. M spike of 2.8.    Pancytopenia: Most likely from progression of her multiple myeloma, other cause include myelodysplasia. DVT ppx, colchicine, clopidogrel were not started based on thrombocytopenia.     Elevated BNP: BNP of 3,270 on 6/27. ECHO was not pursued after a goals of care conversation.   ??  Stage III CKD: Creatinine widely variable in the past year, but average baseline appears to be ~1.5. On presentation, renal function at baseline with Cr. 1.3 and has remained stable.   ??  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 Currently without knee pain, edema or erythema in joints. Colchicine held in setting of low platelet count.    HTN: Held metoprolol and amlodipine in setting of infection.    Hypothyroidism: Recently started on synthroid daily, will continue. TSH within normal limits

## 2018-04-17 NOTE — Unmapped (Signed)
Daily Progress Note    24hr Events:   Overnight, was afebrile but still feels weak that is about the same as yesterday. Both her feet having been causing her pain. She states she can barely use her legs to push off the bed. She had an episode of urinary incontinence prompting catheter placement. Incontinence occurred as she could not get out of bed. Denies previous episodes of incontinence. She expresses having an appetite and denies feeling feverish or short of breath.     Assessment/Plan:    Kayla Torres is a 77 y.o. female who presented to [facility] feeling weaker with progressive loss of weight and appetite and was found to have LUL consolidation concerning for CAP and she was started on Ceftrixone/Azithromycin. She was diagnosed in 12/2013 with multiple myeloma and has been through multiple regiments with more recent Ixazomib/melphalan/prednisone. Her weakness and muscle wasting is significant in her lower extremities. Her history of chemotherapy, current presumed CAP, and MM could all contribute to lack of po intake that may be responsible for her sarcopenia. However, other causes are being looked into such as myopathy, vitamin deficiency, or another neurologic source.         Hypertension      Multiple myeloma in relapse (CMS-HCC)      CKD (chronic kidney disease) stage 3, GFR 30-59 ml/min (CMS-HCC)      Pancytopenia (CMS-HCC)      IgG kappa multiple myeloma: Follows with Synetta Fail and Dr. Anise Salvo in Myeloma clinic. Initially diagnosed in 12/2013 in the setting of worsening back pain. She has been treated with CyborD, bortezomib maintenance, carfilzomib/dex/revlimid, single agent daratumumab, and Benda/Pom/Dex . Most recent treatment with ixazomib/melphalan/prednisone but experienced significant pancytopenias with Ixazomib and current plan is to proceed with melphalan and prednisone alone.   - Total IgG is 4,018 from 4,363 (06/5), IgM <25, IgA 10.9  - multiple myeloma, urine free chains pending  - Continue prednisone 20mg  daily   - Continue calcium and vitamin D supplementation   - Continue valacyclovir 500mg  daily for VZV PPx   ??  Left Apical Pneumonia: S/p empiric coverage with vancomycin and cefepime in the ED. Given normal ANC and relative lack of risk factors for exposure to MDR organisms, suspect CAP.    - IV ceftriaxone and azithromycin   - Bcx NGTD (06/28)  - bedside US on 06/29 confirmed upper lobe consolidation   - If clinically worsens, broaden antibiotics to include MDR organisms    Urinary Incontinence: most likely due to weakness and not getting out of bed in time  -UCx pending  - UA from 06/27 with proteinuria  - Cr stable at 1.22    Foot Pain: most likely from hx of PVD   -Tramadol prn for pain   - Gabapentin  - PT/OT     Sarcopenia: muscle wasting of her lower extremities is significant coupled with muscle weakness. Unsure of what could cause this muscle wasting and weakness picture. DDX includes vitamin deficiency, myopathy, malnutrition, deconditioning, hormone imbalance, ALS, age, polymyositis.    - encourage po intake  - PT/OT  -TSH of 1.079 and on Levo  - Na and K wnl, would expect abnormal in adrenal abnormality  -f/u labs for myopathy: CK, aldolase  - f/u labs for vitamin deficiency: MMA, homocystinuria, B12  - consult neurology for recommendations  - started Drobinol 2.5 mg bid before meals for appetite/taste  ??  Elevated BNP: BNP of 3,270 and and EKG showing sinus tachycardia with premature atrial beats  on 06/27. Could contribute to muscle wasting.   -obtain TTE on 7/1 for further evaluation    Elevated D-dimer: D-dimer elevated at 12,000, obtained in the ED 2/2 tachycardia, underlying malignancy, and inability to obtain CTA due to underlying CKD, although no complaints of dyspnea and no hypoxemia. Given asymptomatic and normal troponin, low suspicion for PE at this time so will defer initiation of anticoagulation.   - PVL lower extremities  - Low threshold to start anticoagulation of clinically decompensates   ??  Stage III CKD: Creatinine widely variable in the past year, but average baseline appears to be ~1.5. On presentation, renal function at baseline with Cr. 1.3.   - Avoid nephrotoxic agents as able   ??  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 Currently without knee pain, edema or erythema in joints.  - Continue prednisone as above   - d/c colchicine  ??  HTN: Hold metoprolol in setting of infection     Hypothyroidism: Recently started on synthroid daily, will continue       ___________________________________________________________________    Subjective:  No acute events overnight    Labs/Studies:  Labs and Studies from the last 24hrs per EMR and Reviewed    Pressure Ulcer(s)    Active Pressure Ulcer     None                     Objective:  BP 116/73  - Pulse 106  - Temp 36.5 ??C (Oral)  - Resp 16  - Ht 170.2 cm (5' 7.01)  - Wt 56.2 kg (123 lb 12.8 oz)  - SpO2 100%  - BMI 19.39 kg/m??     General Appearrance: Alert and oriented x3, NAD, thin, difficulty changing position in bed.  HEENT: EOMI, PERRL, anicteric.  Heart: RRR, no murmurs rubs or gallops, normal S1 and S2.  Lungs: CTAB, no increased WOB, no crackles wheezing or rales.  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   Neuro: CN II-XII grossly intact, normal tone.  Psych: Normal affect, normal speech.   Skin: Intact with no lesions, no erythema, no rash.

## 2018-04-18 LAB — CBC W/ AUTO DIFF
BASOPHILS RELATIVE PERCENT: 1.2 %
EOSINOPHILS ABSOLUTE COUNT: 0 10*9/L (ref 0.0–0.4)
EOSINOPHILS RELATIVE PERCENT: 0.3 %
HEMATOCRIT: 27.6 % — ABNORMAL LOW (ref 36.0–46.0)
HEMOGLOBIN: 8.7 g/dL — ABNORMAL LOW (ref 12.0–16.0)
LARGE UNSTAINED CELLS: 14 % — ABNORMAL HIGH (ref 0–4)
LYMPHOCYTES ABSOLUTE COUNT: 0.1 10*9/L — ABNORMAL LOW (ref 1.5–5.0)
LYMPHOCYTES RELATIVE PERCENT: 4.4 %
MEAN CORPUSCULAR HEMOGLOBIN CONC: 31.4 g/dL (ref 31.0–37.0)
MEAN CORPUSCULAR HEMOGLOBIN: 32.3 pg (ref 26.0–34.0)
MEAN CORPUSCULAR VOLUME: 102.9 fL — ABNORMAL HIGH (ref 80.0–100.0)
MEAN PLATELET VOLUME: 10.2 fL — ABNORMAL HIGH (ref 7.0–10.0)
MONOCYTES ABSOLUTE COUNT: 0.1 10*9/L — ABNORMAL LOW (ref 0.2–0.8)
MONOCYTES RELATIVE PERCENT: 5.7 %
NEUTROPHILS ABSOLUTE COUNT: 0.9 10*9/L — ABNORMAL LOW (ref 2.0–7.5)
NEUTROPHILS RELATIVE PERCENT: 74.8 %
PLATELET COUNT: 28 10*9/L — ABNORMAL LOW (ref 150–440)
RED CELL DISTRIBUTION WIDTH: 20.7 % — ABNORMAL HIGH (ref 12.0–15.0)
WBC ADJUSTED: 1.2 10*9/L — ABNORMAL LOW (ref 4.5–11.0)

## 2018-04-18 LAB — BASIC METABOLIC PANEL
ANION GAP: 8 mmol/L — ABNORMAL LOW (ref 9–15)
BLOOD UREA NITROGEN: 33 mg/dL — ABNORMAL HIGH (ref 7–21)
BUN / CREAT RATIO: 22
CALCIUM: 8 mg/dL — ABNORMAL LOW (ref 8.5–10.2)
CHLORIDE: 111 mmol/L — ABNORMAL HIGH (ref 98–107)
CO2: 18 mmol/L — ABNORMAL LOW (ref 22.0–30.0)
CREATININE: 1.52 mg/dL — ABNORMAL HIGH (ref 0.60–1.00)
EGFR CKD-EPI NON-AA FEMALE: 33 mL/min/{1.73_m2} — ABNORMAL LOW (ref >=60)
GLUCOSE RANDOM: 90 mg/dL (ref 65–179)
POTASSIUM: 3.8 mmol/L (ref 3.5–5.0)
SODIUM: 137 mmol/L (ref 135–145)

## 2018-04-18 LAB — SLIDE REVIEW

## 2018-04-18 LAB — HEMOGLOBIN: Lab: 8.7 — ABNORMAL LOW

## 2018-04-18 LAB — SODIUM: Sodium:SCnc:Pt:Ser/Plas:Qn:: 137

## 2018-04-18 LAB — TOXIC GRANULATION

## 2018-04-18 NOTE — Unmapped (Signed)
I spoke with the patient, who is a very pleasant 77 yo F with multiple myeloma refractory to multiple treatments, on 04/18/18 around 0900 with her nurse in the room. She had just returned from the bathroom with her nurse. She was reportedly unable to lift herself off the toilet and had to be assisted to her chair.    The patient was pleasant and appropriate. She was oriented to person, place and time. I asked her about what we could do to make her happy. She reported that she wanted to get out of the hospital. I spoke with her about the information I had received from her primary oncologist about exhaustion of myeloma-directed therapies. I also spoke with her about what would be involved in a code situation, including chest compressions, electrical shocks, intubation on a breathing machine, and transfer to the MICU. She stated that she did not want those things and would now want to be DNR/DNI. When I discussed the possibility of hospice options, she was very open to his plan given her poor prognosis (per 04/18/18 attending attestation: Her prognosis is poor. Her M protein is increasing suggesting resistance to therapy.  While we do have correctable problems, ultimately we are going to lose this battle.).    I spoke with the patient's daughter, Ms. Anderson, around [phone]. She reported that in talking with Dr. Anise Salvo and Synetta Fail NP, her understanding is that all treatment options at present had been exhausted but was getting medications that did not appear to be working. She reported she had spoken with her mother and an attorney with regards to DNR and DNI and that her mother had expressed interest prior to this hospitalization and papers had been drawn up. The daughter stated she had those papers at home.     I spoke with the patient and her daughter around 66 on 04/18/18 in the hospital. The two had spoken with the case manager. The patient likely would not qualify for inpatient hospice at present, but her daughter stated that they may be interested in skilled nursing facilities and in referral to agencies that could help with SNF transition to hospice options.     We spoke about further testing and studies. The patient's daughter would be interested in focus on palliative care and seems less interested in pursuing further diagnostic tests such as echocardiography when presented with options for dyspnea and edema management like PRN diuretics.    Summary: Patient is now DNR/DNI; interest in going to SNF with hospice referrals. This was confirmed with her daughter in conversation at the hospital on 04/18/18.    Arsema Tusing A Jacere Pangborn   04/18/18

## 2018-04-18 NOTE — Unmapped (Signed)
Pt alert and oriented, up x1 person assist to bsc, chair per report. However, pt has wanted to use the bedpan overnight. Pt has voided this shift so far, bladder scanned multiple times and showed 0ml. Continues to have poor appetite; ate less than 25% of dinner. VSS and afebrile. Will continue to monitor.

## 2018-04-18 NOTE — Unmapped (Signed)
Order was placed for PIV by Venous Access Team (VAT).  Patient was assessed for placement of a PIV. Access was obtained. Blood return noted.  Dressing intact and device well secured.  Flushed with normal saline.  Pt advised to inform RN of any s/s of discomfort at the PIV site.    Workup / Procedure Time:  15 minutes      The primary RN was notified.       Thank you,     Marylee Floras RN Venous Access Team

## 2018-04-18 NOTE — Unmapped (Signed)
Adult Nutrition Consult     Visit Type: RN Consult via Interdisciplinary Screening and Assessment Form. Identifiers: Decreased appetite over the last month  Reason for Visit:  Assessment    ASSESSMENT:   HPI & PMH: Per MD note, patient is a 77 y.o. female with PMHx significant for multiple myeloma, CKD, HTN, crystalline arthropathy, and hypothyroidism that presented to [facility] with weakness.   Nutrition Hx: RD spoke with patient and daughter this afternoon.  Patient endorsed a decrease in appetite over the last 2 weeks that has gotten worse in the last week.  Patient indicates that she lives with her grandson and a friend that do cook for her, but she will take a few bites and states that she doesn't want anymore.  Patient endorsed eating 100% of cream of wheat and toast this morning.  Daughter indicates that patient weighed 132# ~1 month ago.  Patient amenable to nutrition supplements.    Nutritionally Pertinent Meds: Levothyroxine, Predisone  Labs: Hgb: 8.7(L), Hct: 27.6(L), Cl: 111(H), BUN: 33(H), creat: 1.52(H)  Abd/GI: Denies N, but states she feels like she has to throw up.  Last BM: 06/30 - per RN shift assessment   Skin: wound report reviewed  Patient Lines/Drains/Airways Status    Active Wounds     None               Current nutrition therapy order:   Nutrition Orders          Nutrition Therapy General (Regular) starting at 06/27 2221           Anthropometric Data:  -- Height: 170.2 cm (5' 7.01)   -- Last recorded weight: 56.2 kg (123 lb 12.8 oz)  -- Admission weight: 55.3kg  -- IBW: 61.29 kg  -- Percent IBW: 90.2%  -- BMI: Body mass index is 19.39 kg/m??.   -- Weight changes this admission:   Last 5 Recorded Weights    04/16/18 1631 04/17/18 1102   Weight: 55.3 kg (121 lb 14.6 oz) 56.2 kg (123 lb 12.8 oz)      -- Weight history PTA: -3.2kg (5.4%) in ~5 weeks  Wt Readings from Last 10 Encounters:   04/17/18 56.2 kg (123 lb 12.8 oz)   04/12/18 58.5 kg (129 lb)   04/08/18 58.4 kg (128 lb 12 oz)   03/08/18 59.4 kg (131 lb)   03/03/18 59.4 kg (131 lb)   03/01/18 59.1 kg (130 lb 6.4 oz)   02/24/18 57.7 kg (127 lb 3.3 oz)   02/24/18 60.3 kg (133 lb)   02/02/18 60.3 kg (133 lb)   01/27/18 60.5 kg (133 lb 6.4 oz)        Daily Estimated Nutrient Needs:   Energy: 1405-1686 kcals [25-30 kcal/kg using last recorded weight, 56.2 kg (04/18/18 1257)]  Protein: 67-84 gm [1.2-1.5 gm/kg using last recorded weight, 56.2 kg (04/18/18 1257)]  Carbohydrate:   [45-60% of kcal]  Fluid:   [per MD team]     Nutrition Focused Physical Exam:  Fat Areas Examined  Orbital: Moderate loss  Upper Arm: Moderate loss      Muscle Areas Examined  Temple: Moderate loss  Clavicle: Severe loss  Acromion: Severe loss  Dorsal Hand: Moderate loss  Patellar: Moderate loss         Nutrition Evaluation  Overall Impressions: Moderate fat loss;Severe muscle loss (04/18/18 1252)  Nutrition Designation: Normal weight (BMI 18.50 - 24.99 kg/m2) (04/18/18 1252)     DIAGNOSIS:  Malnutrition Assessment using AND/ASPEN Clinical Characteristics:  Severe Protein-Calorie Malnutrition in the context of chronic illness (04/18/18 1258)           Energy Intake: Clinical criterion not met  Interpretation of Wt. Loss: > 5% x 1 month  Fat Loss: Moderate  Muscle Loss: Severe           Overall nutrition impression: Patient not meeting estimated needs PTA due to loss of appetite.  Documentation indicates a 3.2kg (5.4%) in ~5 weeks.  Patient encouraged to eat smaller meals that include some protein several times a day to help increase po intake.  Patient would benefit from nutrition supplements.      - Inadequate energy intake related to loss of appetite as evidenced by documented weight loss.     GOALS:  Oral Intake:       - Patient to consume 50-75% of 3 meals per day.  - Patient to consume 100% of at least 2 oral supplements per day.  Anthropometric:       - Maintain weight within 2% of 56.2 kg over course of hospitalization.     RECOMMENDATIONS AND INTERVENTIONS:  Recommend continue current nutrition therapy  Recommend Ensure pudding nutrition supplements TID  Request weekly weight(s)  Monitor po intake and record % meals in Epic     RD Follow Up Parameters:  1-2 times per week (and more frequent as indicated)     I appreciate the opportunity to participate in the care of this patient.  Please contact me with any questions.    Terrence Dupont, MS, RD, LDN  Pager: [phone]

## 2018-04-18 NOTE — Unmapped (Signed)
Pt AAO x4, forgetful at times. Pt afebrile and c/o bilateral foot pain. Pt was medicated wit tramadol x1 during shift. Pt reports a relief now. Pt sat on chair most of shift, family was at bedside.

## 2018-04-19 LAB — BASIC METABOLIC PANEL
ANION GAP: 12 mmol/L (ref 9–15)
BLOOD UREA NITROGEN: 36 mg/dL — ABNORMAL HIGH (ref 7–21)
BUN / CREAT RATIO: 20
CALCIUM: 7.9 mg/dL — ABNORMAL LOW (ref 8.5–10.2)
CHLORIDE: 108 mmol/L — ABNORMAL HIGH (ref 98–107)
CO2: 16 mmol/L — ABNORMAL LOW (ref 22.0–30.0)
CREATININE: 1.77 mg/dL — ABNORMAL HIGH (ref 0.60–1.00)
EGFR CKD-EPI AA FEMALE: 32 mL/min/{1.73_m2} — ABNORMAL LOW (ref >=60)
GLUCOSE RANDOM: 70 mg/dL (ref 65–179)
POTASSIUM: 3.7 mmol/L (ref 3.5–5.0)
SODIUM: 136 mmol/L (ref 135–145)

## 2018-04-19 LAB — EGFR CKD-EPI NON-AA FEMALE: Lab: 28 — ABNORMAL LOW

## 2018-04-19 LAB — CBC W/ AUTO DIFF
BASOPHILS ABSOLUTE COUNT: 0 10*9/L (ref 0.0–0.1)
EOSINOPHILS ABSOLUTE COUNT: 0 10*9/L (ref 0.0–0.4)
EOSINOPHILS RELATIVE PERCENT: 0.3 %
HEMATOCRIT: 29.5 % — ABNORMAL LOW (ref 36.0–46.0)
HEMOGLOBIN: 9.1 g/dL — ABNORMAL LOW (ref 12.0–16.0)
LARGE UNSTAINED CELLS: 15 % — ABNORMAL HIGH (ref 0–4)
LYMPHOCYTES ABSOLUTE COUNT: 0.1 10*9/L — ABNORMAL LOW (ref 1.5–5.0)
LYMPHOCYTES RELATIVE PERCENT: 6.2 %
MEAN CORPUSCULAR HEMOGLOBIN CONC: 30.7 g/dL — ABNORMAL LOW (ref 31.0–37.0)
MEAN CORPUSCULAR HEMOGLOBIN: 31.3 pg (ref 26.0–34.0)
MEAN CORPUSCULAR VOLUME: 101.9 fL — ABNORMAL HIGH (ref 80.0–100.0)
MONOCYTES ABSOLUTE COUNT: 0.1 10*9/L — ABNORMAL LOW (ref 0.2–0.8)
MONOCYTES RELATIVE PERCENT: 9.2 %
NEUTROPHILS ABSOLUTE COUNT: 1 10*9/L — ABNORMAL LOW (ref 2.0–7.5)
NEUTROPHILS RELATIVE PERCENT: 69.2 %
PLATELET COUNT: 23 10*9/L — ABNORMAL LOW (ref 150–440)
RED BLOOD CELL COUNT: 2.89 10*12/L — ABNORMAL LOW (ref 4.00–5.20)
RED CELL DISTRIBUTION WIDTH: 20.7 % — ABNORMAL HIGH (ref 12.0–15.0)
WBC ADJUSTED: 1.4 10*9/L — ABNORMAL LOW (ref 4.5–11.0)

## 2018-04-19 LAB — SLIDE REVIEW

## 2018-04-19 LAB — SMEAR REVIEW

## 2018-04-19 LAB — PATHOLOGIST SMEAR INTERPRETATION

## 2018-04-19 LAB — HEMATOCRIT: Lab: 29.5 — ABNORMAL LOW

## 2018-04-19 NOTE — Unmapped (Signed)
Pt alert and oriented. With bil knees and foot pain. Scheduled tylenol and prn tramadol given with some relief. Pt had loose stool x 1 today. Voiding. Very poor oral intake. up with assist using a walker/sara stedy. Afebrile. Will continue to monitor.

## 2018-04-19 NOTE — Unmapped (Signed)
Pt A/Ox4, remains afebrile, VSS, On RA. up x1 person assist to [facility]. Denies any pain. Generalized weakness noted. Continues to have poor appetite; Free from falls. Reviewed POC on RN round and denies any concern. Will continue to monitor.

## 2018-04-19 NOTE — Unmapped (Signed)
Daily Progress Note    Interval History/Subjective:   No acute events overnight. Feels that her weakness has not improved much. She has little bit of appetite. Currently a catheter in place, and is having BMs with assistance. Denies any foot or leg pain.       Assessment/Plan:    Kayla Torres is a 77 y.o. female with a history of IgG kappa multiple myeloma (diagnosed 12/2013, now s/p multiple regimens including present melphalan and prednisone) who presented to [facility] on 04/15/2018 with weakness and weight loss, found to have LUL consolidation concerning for CAP and she was started on Ceftrixone/Azithromycin. Her weakness and muscle wasting is significant in her lower extremities. Her history of chemotherapy, current presumed CAP, and MM could all contribute to lack of po intake that may be responsible for her sarcopenia. Other causes such as vitamin deficiency or myopathy have been ruled out based on labs. Per 04/18/18 conversations with the patient and her daughter, the patient is now DNR/DNI and interested in looking at [facility] w/ hospice transition.      Hypertension      Multiple myeloma in relapse (CMS-HCC)      CKD (chronic kidney disease) stage 3, GFR 30-59 ml/min (CMS-HCC)      Pancytopenia (CMS-HCC)      IgG kappa multiple myeloma: Follows with Synetta Fail and Dr. Anise Salvo in Myeloma clinic. Initially diagnosed in 12/2013 in the setting of worsening back pain. She has been treated with CyborD, bortezomib maintenance, carfilzomib/dex/revlimid, single agent daratumumab, and Benda/Pom/Dex . Most recent treatment with ixazomib/melphalan/prednisone but experienced significant pancytopenias with Ixazomib and current plan is to proceed with melphalan and prednisone alone.   - Total IgG is 4,018 from 4,363 (06/5), IgM <25, IgA 10.9  - Continue prednisone 20mg  daily   - Continue calcium and vitamin D supplementation   - Continue valacyclovir 500mg  daily for VZV PPx   - cell counts continue to drop   [ ]  f/u 04/18/18 Light chains, urine, MM work up  ??  Left Apical Pneumonia: S/p empiric coverage with vancomycin and cefepime in the ED. Given normal ANC and relative lack of risk factors for exposure to MDR organisms, suspect CAP.    - IV ceftriaxone x 5 days (6/28 - )  - PO azithromycin x 5 days (6/28 - 7/2)  - 6/28 BCx x 2 - NGTD  - bedside US on 06/29 confirmed upper lobe consolidation   - If clinically worsens, broaden antibiotics to include MDR organisms    Stage III CKD: Creatinine widely variable in the past year, but average baseline appears to be ~1.5. On presentation, renal function at baseline with Cr. 1.3.   - 04/19/18 Cr 1.77, up from 1.5 on 04/18/18.  - IVF?  - Daily BUN/Cr  - Avoid nephrotoxic agents as able     Foot Pain: most likely from hx of PVD   -Tramadol prn for pain   - Gabapentin  - PT/OT     Sarcopenia: muscle wasting of her lower extremities is significant coupled with muscle weakness. Unsure of what could cause this muscle wasting and weakness picture. DDX includes vitamin deficiency, myopathy, malnutrition, deconditioning, hormone imbalance, ALS, age, polymyositis.    - encourage po intake  - PT/OT  -TSH of 1.079 and on Levo  - Na and K wnl, would expect abnormal in adrenal abnormality  - B12, homocystinuria, CK within normal limits  - f/u aldolase  - d/c Drobinol 2.5 mg bid  - decreased Prednisone to  10 mg daily x 2 days then taper to 5 mg x 2 days  ??  Elevated BNP: BNP of 3,270 and and EKG showing sinus tachycardia with premature atrial beats on 06/27. Could contribute to muscle wasting. - TTE cancelled ordered in line with 04/18/18 GOC convo    Elevated D-dimer: D-dimer elevated at 12,000, obtained in the ED 2/2 tachycardia, underlying malignancy, and inability to obtain CTA due to underlying CKD, although no complaints of dyspnea and no hypoxemia. Given asymptomatic and normal troponin, low suspicion for PE at this time so will defer initiation of anticoagulation.   - PVL lower extremities  - Low threshold to start anticoagulation of clinically decompensates   ??  ??  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 Currently without knee pain, edema or erythema in joints.  - Continue prednisone as above   - d/c colchicine  ??  HTN: Holding metoprolol in setting of infection     Hypothyroidism: Recently started on synthroid daily, will continue     FEN: Regular diet with supplements  GI: Not indicated  VTE PPx: SCDs  Code Status: DNR and DNI  Dispo: E1 floor; likely d/c to SNF w/ hospice referrals given lack of further treatment options for multiple myeloma  ___________________________________________________    Subjective:  No acute events overnight    Labs/Studies:  Labs and Studies from the last 24hrs per EMR and Reviewed    Pressure Ulcer(s)    Active Pressure Ulcer     None                Malnutrition Evaluation as performed by RD, LDN: Severe Protein-Calorie Malnutrition in the context of chronic illness (04/18/18 1258)    Objective:  BP 113/87  - Pulse 99  - Temp 36.4 ??C (Oral)  - Resp 16  - Ht 170.2 cm (5' 7.01)  - Wt 59.2 kg (130 lb 8.2 oz)  - SpO2 98%  - BMI 20.44 kg/m??     General Appearrance: Alert and oriented x3, NAD, thin, sitting in chair by window  HEENT: EOMI, PERRL, anicteric.  Heart: RRR, no murmurs rubs or gallops, normal S1 and S2.  Lungs: CTAB, no increased WOB, no crackles wheezing or rales.  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   Psych: Normal affect, normal speech.   Skin: Intact with no lesions, no erythema, no rash.

## 2018-04-19 NOTE — Unmapped (Signed)
Daily Progress Note    Interval History/Subjective:   No acute events overnight.  Reportedly poor appetite per overnight nurse.  Urinating.  Bowel movements recorded.  PRN Tylenol and tramadol given for bilateral knee and foot pain.  Slow ambulation with walker in room at start of 04/18/18 day shift, but reported difficulty with rising from toilet per nursing later in shift.  Afebrile. Goals of care discussion held on 04/18/2018 with patient and her daughter.  Advanced care planning note documenting discussion of interest in SNF with hospice options entered in Epic. Patient is now DNR/DNI per 04/18/18 discussion.    Assessment/Plan:    Kayla Torres is a 77 y.o. female with a history of IgG kappa multiple myeloma (diagnosed 12/2013, now s/p multiple regimens including present melphalan and prednisone) who presented to [facility] on 04/15/2018 with weakness and weight loss, found to have LUL consolidation concerning for CAP and she was started on Ceftrixone/Azithromycin. Her weakness and muscle wasting is significant in her lower extremities. Her history of chemotherapy, current presumed CAP, and MM could all contribute to lack of po intake that may be responsible for her sarcopenia. However, other causes are being looked into such as myopathy, vitamin deficiency, or another neurologic source. Per 04/18/18 conversations with the patient and her daughter, the patient is now DNR/DNI and interested in looking at [facility] w/ hospice transition.      Hypertension      Multiple myeloma in relapse (CMS-HCC)      CKD (chronic kidney disease) stage 3, GFR 30-59 ml/min (CMS-HCC)      Pancytopenia (CMS-HCC)      IgG kappa multiple myeloma: Follows with Synetta Fail and Dr. Anise Salvo in Myeloma clinic. Initially diagnosed in 12/2013 in the setting of worsening back pain. She has been treated with CyborD, bortezomib maintenance, carfilzomib/dex/revlimid, single agent daratumumab, and Benda/Pom/Dex . Most recent treatment with ixazomib/melphalan/prednisone but experienced significant pancytopenias with Ixazomib and current plan is to proceed with melphalan and prednisone alone.   - Total IgG is 4,018 from 4,363 (06/5), IgM <25, IgA 10.9  - Continue prednisone 20mg  daily   - Continue calcium and vitamin D supplementation   - Continue valacyclovir 500mg  daily for VZV PPx   [ ]  f/u 04/18/18 Light chains, urine  ??  Left Apical Pneumonia: S/p empiric coverage with vancomycin and cefepime in the ED. Given normal ANC and relative lack of risk factors for exposure to MDR organisms, suspect CAP.    - IV ceftriaxone x 5 days (6/28 - )  - PO azithromycin x 5 days (6/28 - )  - 6/28 BCx x 2 - NGTD  - bedside US on 06/29 confirmed upper lobe consolidation   - If clinically worsens, broaden antibiotics to include MDR organisms    Urinary Incontinence: most likely due to weakness and not getting out of bed in time. 04/15/18 UCx w/ mixed urogenital flora.  - UA from 06/27 with proteinuria  - Cr stable at 1.22    Foot Pain: most likely from hx of PVD   -Tramadol prn for pain   - Gabapentin  - PT/OT     Sarcopenia: muscle wasting of her lower extremities is significant coupled with muscle weakness. Unsure of what could cause this muscle wasting and weakness picture. DDX includes vitamin deficiency, myopathy, malnutrition, deconditioning, hormone imbalance, ALS, age, polymyositis.    - encourage po intake  - PT/OT  -TSH of 1.079 and on Levo  - Na and K wnl, would expect abnormal in  adrenal abnormality  -f/u labs for myopathy: CK, aldolase  - f/u labs for vitamin deficiency: MMA, homocystinuria, B12  - consult neurology for recommendations  - started Drobinol 2.5 mg bid before meals for appetite/taste  ??  Elevated BNP: BNP of 3,270 and and EKG showing sinus tachycardia with premature atrial beats on 06/27. Could contribute to muscle wasting. - TTE ordered for 7/1 for further evaluation, though may cancel in line with 04/18/18 GOC convo    Elevated D-dimer: D-dimer elevated at 12,000, obtained in the ED 2/2 tachycardia, underlying malignancy, and inability to obtain CTA due to underlying CKD, although no complaints of dyspnea and no hypoxemia. Given asymptomatic and normal troponin, low suspicion for PE at this time so will defer initiation of anticoagulation.   - PVL lower extremities  - Low threshold to start anticoagulation of clinically decompensates   ??  Stage III CKD: Creatinine widely variable in the past year, but average baseline appears to be ~1.5. On presentation, renal function at baseline with Cr. 1.3.   - 04/18/18 Cr 1.5, up from 1.22 on 04/17/18.  - Daily BUN/Cr  - Avoid nephrotoxic agents as able   ??  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 Currently without knee pain, edema or erythema in joints.  - Continue prednisone as above   - d/c colchicine  ??  HTN: Holding metoprolol in setting of infection     Hypothyroidism: Recently started on synthroid daily, will continue     FEN: Regular diet with supplements  GI: Not indicated  VTE PPx: SCDs  Code Status: DNR and DNI  Dispo: E1 floor; likely d/c to SNF w/ hospice referrals given lack of further treatment options for multiple myeloma  ___________________________________________________    Subjective:  No acute events overnight    Labs/Studies:  Labs and Studies from the last 24hrs per EMR and Reviewed    Pressure Ulcer(s)    Active Pressure Ulcer     None                Malnutrition Evaluation as performed by RD, LDN: Severe Protein-Calorie Malnutrition in the context of chronic illness (04/18/18 1258)    Objective:  BP 107/69  - Pulse 83  - Temp 36.4 ??C (Oral)  - Resp 18  - Ht 170.2 cm (5' 7.01)  - Wt 56.2 kg (123 lb 12.8 oz)  - SpO2 99%  - BMI 19.39 kg/m??     General Appearrance: Alert and oriented x3, NAD, thin, sitting in chair by window  HEENT: EOMI, PERRL, anicteric.  Heart: RRR, no murmurs rubs or gallops, normal S1 and S2.  Lungs: CTAB, no increased WOB, no crackles wheezing or rales.  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   Neuro: A&O x 3. CN II-XII grossly intact, normal tone.  Psych: Normal affect, normal speech.   Skin: Intact with no lesions, no erythema, no rash.

## 2018-04-20 LAB — CBC W/ AUTO DIFF
HEMATOCRIT: 29.4 % — ABNORMAL LOW (ref 36.0–46.0)
HEMOGLOBIN: 9.1 g/dL — ABNORMAL LOW (ref 12.0–16.0)
MEAN CORPUSCULAR HEMOGLOBIN CONC: 31 g/dL (ref 31.0–37.0)
MEAN CORPUSCULAR VOLUME: 104.3 fL — ABNORMAL HIGH (ref 80.0–100.0)
PLATELET COUNT: 17 10*9/L — ABNORMAL LOW (ref 150–440)
RED BLOOD CELL COUNT: 2.82 10*12/L — ABNORMAL LOW (ref 4.00–5.20)
RED CELL DISTRIBUTION WIDTH: 20.9 % — ABNORMAL HIGH (ref 12.0–15.0)
WBC ADJUSTED: 1.3 10*9/L — ABNORMAL LOW (ref 4.5–11.0)

## 2018-04-20 LAB — MANUAL DIFFERENTIAL
BASOPHILS - ABS (DIFF): 0 10*9/L (ref 0.0–0.1)
BASOPHILS - REL (DIFF): 0 %
EOSINOPHILS - REL (DIFF): 0 %
LYMPHOCYTES - ABS (DIFF): 0.1 10*9/L — ABNORMAL LOW (ref 1.5–5.0)
LYMPHOCYTES - REL (DIFF): 7 %
MONOCYTES - ABS (DIFF): 0.2 10*9/L (ref 0.2–0.8)
NEUTROPHILS - ABS (DIFF): 1.1 10*9/L — ABNORMAL LOW (ref 2.0–7.5)

## 2018-04-20 LAB — MYELOMA WORKUP, SERUM
ALPHA-2 GLOBULIN: 0.8 g/dL (ref 0.5–1.1)
BETA-1 GLOBULIN: 0.1 g/dL — ABNORMAL LOW (ref 0.3–0.6)
BETA-2 GLOBULIN: 0.1 g/dL — ABNORMAL LOW (ref 0.2–0.6)
M SPIKE: 2.8 g/dL — ABNORMAL HIGH
PROTEIN TOTAL: 6.8 g/dL (ref 6.5–8.3)

## 2018-04-20 LAB — ALDOLASE: Aldolase:CCnc:Pt:Ser/Plas:Qn:: 10 — ABNORMAL HIGH

## 2018-04-20 LAB — BASIC METABOLIC PANEL
ANION GAP: 13 mmol/L (ref 9–15)
BLOOD UREA NITROGEN: 38 mg/dL — ABNORMAL HIGH (ref 7–21)
BUN / CREAT RATIO: 23
CALCIUM: 8 mg/dL — ABNORMAL LOW (ref 8.5–10.2)
CHLORIDE: 109 mmol/L — ABNORMAL HIGH (ref 98–107)
CO2: 14 mmol/L — ABNORMAL LOW (ref 22.0–30.0)
CREATININE: 1.62 mg/dL — ABNORMAL HIGH (ref 0.60–1.00)
EGFR CKD-EPI NON-AA FEMALE: 31 mL/min/{1.73_m2} — ABNORMAL LOW (ref >=60)
POTASSIUM: 4.1 mmol/L (ref 3.5–5.0)
SODIUM: 136 mmol/L (ref 135–145)

## 2018-04-20 LAB — M SPIKE: Lab: 2.8 — ABNORMAL HIGH

## 2018-04-20 LAB — PLATELET COUNT: Lab: 17 — ABNORMAL LOW

## 2018-04-20 LAB — LYMPHOCYTES - REL (DIFF): Lab: 7

## 2018-04-20 LAB — ANION GAP: Anion gap 3:SCnc:Pt:Ser/Plas:Qn:: 13

## 2018-04-20 NOTE — Unmapped (Signed)
Daily Progress Note    Interval History/Subjective:   No acute events overnight. Feels that her weakness has not improved much. She still does not have much appetite but ate lunch this morning. Her last dose of Ceftriaxone/Azithro for CAP was on 7/2.       Assessment/Plan:    Kayla Torres is a 77 y.o. female with a history of IgG kappa multiple myeloma (diagnosed 12/2013, now s/p multiple regimens including present melphalan and prednisone) who presented to [facility] on 04/15/2018 with weakness and weight loss, found to have LUL consolidation concerning for CAP and she was started on Ceftrixone/Azithromycin until 7/2. Her weakness and muscle wasting is significant in her lower extremities. Her history of chemotherapy, current presumed CAP, and MM could all contribute to lack of po intake that may be responsible for her sarcopenia. Other causes such as vitamin deficiency or myopathy have been ruled out based on labs. Per 04/18/18 conversations with the patient and her daughter, the patient is now DNR/DNI and interested in looking at [facility] w/ hospice transition.      Hypertension      Multiple myeloma in relapse (CMS-HCC)      CKD (chronic kidney disease) stage 3, GFR 30-59 ml/min (CMS-HCC)      Pancytopenia (CMS-HCC)      IgG kappa multiple myeloma: Follows with Synetta Fail and Dr. Anise Salvo in Myeloma clinic. Initially diagnosed in 12/2013 in the setting of worsening back pain. She has been treated with CyborD, bortezomib maintenance, carfilzomib/dex/revlimid, single agent daratumumab, and Benda/Pom/Dex . Most recent treatment with ixazomib/melphalan/prednisone but experienced significant pancytopenias with Ixazomib and current plan is to proceed with melphalan and prednisone alone.   - Total IgG is 4,018 from 4,363 (06/5), IgM <25, IgA 10.9  - Prednisone changed to 10 mg x1 week, taper down to 5 mg  - Continue calcium and vitamin D supplementation   - Continue valacyclovir 500mg  daily for VZV PPx   - cell counts continue to drop   [ ]  f/u 04/18/18 Light chains, urine, MM work up  ??  Left Apical Pneumonia: S/p empiric coverage with vancomycin and cefepime in the ED. Given normal ANC and relative lack of risk factors for exposure to MDR organisms, suspect CAP.    - IV ceftriaxone x 5 days (6/28 - 7/2 )  - PO azithromycin x 5 days (6/28 - 7/2)  - 6/28 BCx x 2 - NGTD  - bedside US on 06/29 confirmed upper lobe consolidation     Stage III CKD: Creatinine widely variable in the past year, but average baseline appears to be ~1.5. On presentation, renal function at baseline with Cr. 1.3.   - 04/20/18 Cr 1.62, from 1.77 on 7/1.  - IVF prn  - Daily BUN/Cr  - Avoid nephrotoxic agents as able     Foot Pain: most likely from hx of PVD   - Tramadol prn for pain   - Gabapentin  - PT/OT     Sarcopenia: muscle wasting of her lower extremities is significant coupled with muscle weakness. Unsure of what could cause this muscle wasting and weakness picture. DDX includes vitamin deficiency, myopathy, malnutrition, deconditioning, hormone imbalance, ALS, age, polymyositis.    - encourage po intake  - PT/OT  -TSH of 1.079 and on Levo  - Na and K wnl, would expect abnormal in adrenal abnormality  - B12, homocystinuria, CK within normal limits  - d/c Drobinol 2.5 mg bid  ??  Elevated BNP: BNP of 3,270 and and  EKG showing sinus tachycardia with premature atrial beats on 06/27. Could contribute to muscle wasting. - TTE cancelled ordered in line with 04/18/18 GOC convo    Elevated D-dimer: D-dimer elevated at 12,000, obtained in the ED 2/2 tachycardia, underlying malignancy, and inability to obtain CTA due to underlying CKD, although no complaints of dyspnea and no hypoxemia. Given asymptomatic and normal troponin, low suspicion for PE at this time so will defer initiation of anticoagulation.   - Low threshold to start anticoagulation of clinically decompensates   ??  ??  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 Currently without knee pain, edema or erythema in joints.  - Continue prednisone as above   - d/c colchicine  ??  HTN: Holding metoprolol     Hypothyroidism: Recently started on synthroid daily, will continue     FEN: Regular diet with supplements  GI: Not indicated  VTE PPx: SCDs  Code Status: DNR and DNI  Dispo: E1 floor; likely d/c to SNF w/ hospice referrals given lack of further treatment options for multiple myeloma  ___________________________________________________    Subjective:  No acute events overnight    Labs/Studies:  Labs and Studies from the last 24hrs per EMR and Reviewed    Pressure Ulcer(s)    Active Pressure Ulcer     None                Malnutrition Evaluation as performed by RD, LDN: Severe Protein-Calorie Malnutrition in the context of chronic illness (04/18/18 1258)    Objective:  BP 135/70  - Pulse 86  - Temp 36.3 ??C (Oral)  - Resp 16  - Ht 170.2 cm (5' 7.01)  - Wt 57.1 kg (125 lb 14.5 oz)  - SpO2 98%  - BMI 19.71 kg/m??     General Appearrance: Alert and oriented x3, NAD, thin.  HEENT: EOMI, PERRL, anicteric.  Heart: RRR, no murmurs rubs or gallops, normal S1 and S2.  Lungs: CTAB, no increased WOB, no crackles wheezing or rales.  Extremities: No pitting edema, bilateral LE thin without much muscle, missing right LE digits 4 and 5 from amputation, 2/5 strength of LE bilaterally.   Psych: Normal affect, normal speech.   Skin: Intact with no lesions, no erythema, no rash.

## 2018-04-20 NOTE — Unmapped (Signed)
OCCUPATIONAL THERAPY  Evaluation (04/19/18 1515)    Patient Name:  Kayla Torres       Medical Record Number: [id number]   Date of Birth: 1940-11-19  Sex: Female          OT Treatment Diagnosis:  ADL and functional mobility deficits    Assessment  Kayla Torres is a 77 y.o. female with PMHx significant for multiple myeloma, CKD, HTN, crystalline arthropathy, and hypothyroidism that presented to [facility] with weakness. Pt presents to acute OT with limitations in ADL and functional mobility independence 2/2 functional strength and endurance deficits. Pt will benefit from skilled acute OT and post acute OT 5x/week (low intensity) to facilitate safe return home, decrease future falls, and reduce burden of care.        Based on the daily activity AM-PAC raw score of 17/24, the pt is considered to be 50.11% impaired with self care. After review of pt's occupational profile and history, assessment of occupational performance, clinical decision making, and development of POC, pt presents as a moderate complexity case.    Activity Tolerance During Today's Session  Patient tolerated treatment well    Plan  Planned Frequency of Treatment:  1-2x per day for: 3-4x week       Planned Interventions:  Adaptive equipment;Compensatory tech. training;ADL retraining;Balance activities;Conservation;Bed mobility;Education - Patient;Education - Family / caregiver;Home exercise program;Functional mobility;Functional cognition;Endurance activities;Neuromuscular re-education;Modalities;Postular / Proximal stability;Positioning;Range of motion;Safety education;Therapeutic exercise;UE Strength / coordination exercise;Wheelchair training;Transfer training;Visual / perceptual tasks    Post-Discharge Occupational Therapy Recommendations:  OT Post Acute Discharge Recommendations: 5x weekly;Low intensity    ;    OT DME Recommendations: Defer to post acute    GOALS:   Patient and Family Goals: To be able to walk and go to the bathroom    Long Term Goal #1: In 6 weeks, Pt will score AMPAC 21/24       Short Term:  Pt will tolerate 5 minute seated EOB ADL with SBA    Time Frame : 2 weeks  Pt will be SBA + LRAD for LBD   Time Frame : 2 weeks  Pt will be MIN A + LRAD for toileting and toilet t/f    Time Frame : 2 weeks  Pt will complete 5 minute standing ADL with MIN A + LRAD   Time Frame : 2 weeks  Pt will be independent with HEP for functional strengthening   Time Frame : 2 weeks    Prognosis:  Good  Positive Indicators:  Motivation, family support  Barriers to Discharge: Inability to safely perform ADLS;Endurance deficits;Functional strength deficits;Inaccessible home environment;Severity of deficits    Subjective  Current Status Pt received, left in bed w/ HOB @ 30*, all needs met, call bell in reach, Rn Deborah aware   Prior Functional Status Pt reports living with grandson. Pt reports independence with ADL at home, donning own socks.     Medical Tests / Procedures: Reviewed labs and imaging  Services patient receives: OT;PT  Patient / Caregiver reports: I feel tired    Past Medical History:   Diagnosis Date   ??? Arthritis    ??? Chronic kidney disease    ??? High cholesterol    ??? Hypertension    ??? Multiple myeloma (CMS-HCC) 01/18/2014    Social History     Tobacco Use   ??? Smoking status: Former Smoker     Packs/day: 0.50     Last attempt to quit: 01/17/2014     Years  since quitting: 4.2   ??? Smokeless tobacco: Never Used   Substance Use Topics   ??? Alcohol use: No      Past Surgical History:   Procedure Laterality Date   ??? negative surgical history     ??? PR AMPUTATION TOE,MT-P JT Right 10/24/2017    Procedure: AMPUTATION, TOE; METATARSOPHALANGEAL JOINT;  Surgeon: Boykin Reaper, MD;  Location: MAIN OR [facility];  Service: Vascular    Family History   Problem Relation Age of Onset   ??? Hypertension Mother    ??? Hypertension Sister    ??? Hypertension Brother         Patient has no known allergies.     Objective Findings  Precautions / Restrictions  Neutropenic precautions;Falls precautions    Weight Bearing  Non-applicable    Required Braces or Orthoses  Non-applicable    Communication Preference  Verbal    Pain  No c/o pain    Equipment / Environment  Vascular access (PIV, TLC, Port-a-cath, PICC);Rolling walker    Living Situation   Living environment: House   Lives With: Family(grandson)   Home Living: Multi-level home;Stairs to enter with rails;Stairs to alternate level with rails;Tub/shower unit;Standard height toilet(Pt reports being unable to live on first floor)   Equipment available at home: [employer];Shower Building control surveyor placement (outside): Bilateral rails        Cognition   Orientation Level:  Oriented x 4   Arousal/Alertness:  Appropriate responses to stimuli   Attention Span:  Appears intact   Memory:  Appears intact   Following Commands:  Follows all commands and directions without difficulty   Safety Judgment:  Good awareness of safety precautions   Awareness of Errors:  Good awareness of safety precautions   Problem Solving:  Able to problem solve independently   Comments:      Vision / Perception  Vision: Does not wear glasses  Perception: Appears WFL       Hand Function  Hand Dominance: R  B hands 4/5 MMT    Skin Inspection  Visible skin C/D/I    ROM / Strength/Coordination  UE ROM/ Strength/ Coordination: Appears WFL  LE ROM/ Strength/ Coordination: Bilateral leg weakness noted. Pt required MOD A to swing legs into bed.    Sensation:       Balance:  Pt tolerated 2 minutes static sitting EOB with SBA. Pt performed static standing for ~30 seconds with no LOB, CGA, and RW.    Mobility/Gait/Transfers: Pt performed sup>sit with MIN A, sit<>stand with MAX A, and sit>sup with MAX A.    ADL:  Bathing: Anticipate CGA seated  Grooming: Anticipate MIN A for standing sinkside  Dressing: Pt doffed R sock with CGA and donned R sock with TOTAL A 2/2 fatigue. Anticipate CGA for UBD.     Toileting: Anticipate MAX A for toilet t/f and toileting.        Vitals/ Orthostatics:  At Rest: NAD  With Activity: NAD  Orthostatics: Pt reports mild dizziness sitting, improves with time     Interventions Performed During Today's Session: [facility] 17/24. Pt was facilitated in sup<>sit and sit<>stand t/f. Pt was educated on OT role, POC, and incentive spirometer. Pt performed side rolling in bed to assist therapist with gown management. Pt was positioned in bed for comfort.          Eval Duration (OT): 20 Min.    Medical Staff Made Aware: RN Gavin Pound     I attest that I have  reviewed the above information.  Signed: Bernarda Caffey, OT  Filed 04/19/2018    I was physically present and immediately available to direct and supervise tasks that were related to patient management, performed by Fabian Sharp OTS. The direction and supervision was continuous throughout the time these tasks were performed.     Bernarda Caffey, OT

## 2018-04-20 NOTE — Unmapped (Signed)
Pt A/Ox4, Scheduled Tylenol given for with bil knees and foot pain with some relief. Up to [facility] with walker. Very poor oral intake. NS IV bolus given for increased Cr. Remains afebrile. Free from falls. Will continue to monitor.

## 2018-04-21 LAB — MANUAL DIFFERENTIAL
BASOPHILS - ABS (DIFF): 0 10*9/L (ref 0.0–0.1)
BASOPHILS - REL (DIFF): 0 %
EOSINOPHILS - ABS (DIFF): 0 10*9/L (ref 0.0–0.4)
EOSINOPHILS - REL (DIFF): 0 %
LYMPHOCYTES - ABS (DIFF): 0.3 10*9/L — ABNORMAL LOW (ref 1.5–5.0)
MONOCYTES - ABS (DIFF): 0.1 10*9/L — ABNORMAL LOW (ref 0.2–0.8)
MONOCYTES - REL (DIFF): 7 %
NEUTROPHILS - ABS (DIFF): 0.8 10*9/L — ABNORMAL LOW (ref 2.0–7.5)
NEUTROPHILS - REL (DIFF): 70 %

## 2018-04-21 LAB — CBC W/ AUTO DIFF
HEMOGLOBIN: 8.6 g/dL — ABNORMAL LOW (ref 12.0–16.0)
MEAN CORPUSCULAR HEMOGLOBIN CONC: 31 g/dL (ref 31.0–37.0)
MEAN CORPUSCULAR HEMOGLOBIN: 32 pg (ref 26.0–34.0)
MEAN CORPUSCULAR VOLUME: 103.3 fL — ABNORMAL HIGH (ref 80.0–100.0)
MEAN PLATELET VOLUME: 7.8 fL (ref 7.0–10.0)
PLATELET COUNT: 7 10*9/L — CL (ref 150–440)
RED BLOOD CELL COUNT: 2.68 10*12/L — ABNORMAL LOW (ref 4.00–5.20)
RED CELL DISTRIBUTION WIDTH: 20.4 % — ABNORMAL HIGH (ref 12.0–15.0)
WBC ADJUSTED: 1.2 10*9/L — ABNORMAL LOW (ref 4.5–11.0)

## 2018-04-21 LAB — NEUTROPHILS - ABS (DIFF): Lab: 0.8 — ABNORMAL LOW

## 2018-04-21 LAB — BASIC METABOLIC PANEL
ANION GAP: 12 mmol/L (ref 9–15)
BLOOD UREA NITROGEN: 33 mg/dL — ABNORMAL HIGH (ref 7–21)
BUN / CREAT RATIO: 20
CALCIUM: 8.3 mg/dL — ABNORMAL LOW (ref 8.5–10.2)
CHLORIDE: 109 mmol/L — ABNORMAL HIGH (ref 98–107)
CO2: 15 mmol/L — ABNORMAL LOW (ref 22.0–30.0)
CREATININE: 1.61 mg/dL — ABNORMAL HIGH (ref 0.60–1.00)
EGFR CKD-EPI AA FEMALE: 36 mL/min/{1.73_m2} — ABNORMAL LOW (ref >=60)
EGFR CKD-EPI NON-AA FEMALE: 31 mL/min/{1.73_m2} — ABNORMAL LOW (ref >=60)
GLUCOSE RANDOM: 66 mg/dL (ref 65–179)
SODIUM: 136 mmol/L (ref 135–145)

## 2018-04-21 LAB — MEAN CORPUSCULAR HEMOGLOBIN CONC: Lab: 31

## 2018-04-21 LAB — CALCIUM: Calcium:MCnc:Pt:Ser/Plas:Qn:: 8.3 — ABNORMAL LOW

## 2018-04-21 LAB — METHYLMALONIC ACID: Methylmalonate:SCnc:Pt:Ser/Plas:Qn:: 0.16

## 2018-04-21 MED ORDER — TRAMADOL 50 MG TABLET
ORAL_TABLET | Freq: Three times a day (TID) | ORAL | 0 refills | 0 days | Status: CP | PRN
Start: 2018-04-21 — End: 2018-04-26

## 2018-04-21 MED ORDER — TRANEXAMIC ACID 650 MG TABLET
ORAL_TABLET | Freq: Two times a day (BID) | ORAL | 0 refills | 0.00000 days
Start: 2018-04-21 — End: ?

## 2018-04-21 MED ORDER — CLOTRIMAZOLE 10 MG TROCHE
ORAL_TABLET | ORAL | 0 refills | 0 days
Start: 2018-04-21 — End: 2018-04-28

## 2018-04-21 MED ORDER — PREDNISONE 10 MG TABLET
ORAL_TABLET | 0 refills | 0 days
Start: 2018-04-21 — End: ?

## 2018-04-21 NOTE — Unmapped (Signed)
VSSA on RA. Pt took all medications per order. No acute events. No falls/injuries. Pt up to [facility] with assist, able to assist with turning in bed. Wctm.  Problem: Adult Inpatient Plan of Care  Goal: Plan of Care Review  Outcome: Progressing  Goal: Patient-Specific Goal (Individualization)  Outcome: Progressing  Goal: Absence of Hospital-Acquired Illness or Injury  Outcome: Progressing  Goal: Optimal Comfort and Wellbeing  Outcome: Progressing  Goal: Readiness for Transition of Care  Outcome: Progressing  Goal: Rounds/Family Conference  Outcome: Progressing     Problem: Self-Care Deficit  Goal: Improved Ability to Complete Activities of Daily Living  Outcome: Progressing     Problem: Fall Injury Risk  Goal: Absence of Fall and Fall-Related Injury  Outcome: Progressing     Problem: Pain Acute  Goal: Optimal Pain Control  Outcome: Progressing     Problem: Skin Injury Risk Increased  Goal: Skin Health and Integrity  Outcome: Progressing

## 2018-04-21 NOTE — Unmapped (Signed)
Physician Discharge Summary [facility]  4 ONC [facility]  [address]  [place] [address]  Dept: [phone]  Loc: [phone]     Identifying Information:   Kayla Torres  [birth year]  [id number]    Primary Care Physician: Leone Payor   Code Status: DNR and DNI    Admit Date: 04/15/2018    Discharge Date: 04/21/2018     Discharge To: SNF    Discharge Service: MDE - Hematology Teaching     Discharge Attending Physician: Halford Decamp, MD    Discharge Diagnoses:  Active Problems:    Hypertension    Multiple myeloma in relapse (CMS-HCC)    CKD (chronic kidney disease) stage 3, GFR 30-59 ml/min (CMS-HCC)    Pancytopenia (CMS-HCC)  Resolved Problems:    * No resolved hospital problems. *      Outpatient Provider Follow Up Issues:   She will be going to SNF post discharge and then plans to go to home hospice.   Prednisone taper, daily prednisone 10 mg (7/2-7/9) followed by prednisone 5 mg (7/9-716).  Blood pressure medications have been held during admit and upon discharge due to low blood pressures in setting of pneumonia on admission.  Significant lack of po intake.   Elevated BNP, ECHO was not pursued after goals of care discussion.  She has had thrombocytopenia. Follow up appointment scheduled for 7/10 where transfusion can be initiated if necessary.    Goals of care conversation held, patient desires DNR status. Meeting with Ms. Jurgens and her daughter on 04/18/18 has been documented about pursuing palliative care and hospice after SNF placement.      Hospital Course:   Kayla Torres is a 77 y.o. female with PMHx significant for multiple myeloma, CKD, HTN, crystalline arthropathy, and hypothyroidism that presented to [facility] with weakness, weight loss, and dec    Pneumonia: Presented with weakness, chills and intermittent cough, on evaluation in the ED was hemodynamically stable without hypoxemia but tachycardic with HR 110-120s and febrile to 38.1C. CXR showed left apical consolidative opacity concerning for infection. Received ~3L IVF without significant improvement in tachycardia, but other vital signs and lactate within normal. Blood cultures have not grown anything. Started on Ceftriaxone and Azithromycin for CAP coverage (6/28-7/2).  Bedside POCUS demonstrated findings consistent with upper lobe consolidation. Has been afebrile during admission.     Failure to thrive: Progressive decompensation appreciated by daughter at bedside with poor appetite, weight loss, decreased desire to get out of bed or participate in previously enjoyed activities, and progressive weakness. More acute worsening recently, likely multifactorial in the setting of chemotherapy and progressive multiple myeloma which daughter appreciates as an inciting event as well as infection as above. Workup for vitamin deficiency and myopathy has remained negative, thyroid and adrenal function intact.   ??  Elevated D-dimer: D-dimer elevated at 12,000, obtained in the ED 2/2 tachycardia, underlying malignancy, and did not obtain CTA due to underlying CKD, although no complaints of dyspnea and no hypoxemia.   ??  IgG kappa multiple myeloma: Follows with Synetta Fail and Dr. Anise Salvo in Myeloma clinic. Initially diagnosed in 12/2013 in the setting of worsening back pain. Initially treated with CyBorD, but unfortunately treatment has been complicated by progression on multiple regimens, including bortezomib maintenance, carfilzomib/dex/revlimid, single agent daratumumab, and Benda/Pom/Dex . Most recent treatment has included ixazomib/melphalan/prednisone. While in the hospital, she was given prednisone 20 mg/daily and Valacyclovir 500 mg/daily have been continued She started on a prednisone taper of  10 mg daily from 7/2-7/9 followed by 5 mg daily 7/9-7/16. Total IgG of 3944, IgM of 25, IgA of 10.9. M spike of 2.8.    Pancytopenia: Most likely from progression of her multiple myeloma, other cause include myelodysplasia. DVT ppx, colchicine, clopidogrel were not started based on thrombocytopenia.     Elevated BNP: BNP of 3,270 on 6/27. ECHO was not pursued after a goals of care conversation.   ??  Stage III CKD: Creatinine widely variable in the past year, but average baseline appears to be ~1.5. On presentation, renal function at baseline with Cr. 1.3 and has remained stable.   ??  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 Currently without knee pain, edema or erythema in joints. Colchicine held in setting of low platelet count.    HTN: Held metoprolol  and amlodipine in setting of infection.    Hypothyroidism: Recently started on synthroid daily, will continue. TSH within normal limits            Touchbase with Outpatient Provider:  Warm Handoff: Completed on 04/21/18 by Azucena Freed  (Intern) via [facility] Message    Procedures:  None  No admission procedures for hospital encounter.  ______________________________________________________________________  Discharge Medications:     Your Medication List      STOP taking these medications    alendronate 70 MG tablet  Commonly known as:  FOSAMAX     amLODIPine 5 MG tablet  Commonly known as:  NORVASC     clopidogrel 75 mg tablet  Commonly known as:  PLAVIX     colchicine 0.6 mg tablet  Commonly known as:  COLCRYS     ferrous sulfate 325 (65 FE) MG tablet     magnesium oxide 400 mg (241.3 mg magnesium) tablet  Commonly known as:  MAG-OX     melphalan 2 mg tablet  Commonly known as:  ALKERAN     metoprolol tartrate 25 MG tablet  Commonly known as:  LOPRESSOR     potassium chloride 10 MEQ CR tablet  Commonly known as:  KLOR-CON     zinc gluconate 100 mg Tab        START taking these medications    clotrimazole 10 mg troche  Commonly known as:  MYCELEX  Take 1 tablet (10 mg total) by mouth Five (5) times a day. for 7 days Dissolve slowly in mouth.     tranexamic acid 650 mg Tab tablet  Take 2 tablets (1,300 mg total) by mouth Two (2) times a day.        CHANGE how you take these medications    predniSONE 10 MG tablet  Commonly known as:  DELTASONE  Take 1 tablet (10 mg) by mouth daily from 7/4-7/8, then decrease to 0.5 tablets (5 mg) daily from 7/9-7/16.  What changed:    ?? medication strength  ?? how much to take  ?? how to take this  ?? when to take this  ?? additional instructions  ?? Another medication with the same name was removed. Continue taking this medication, and follow the directions you see here.     valACYclovir 500 MG tablet  Commonly known as:  VALTREX  Take 1 tablet (500 mg total) by mouth every other day.  Start taking on:  04/22/2018  What changed:  when to take this        CONTINUE taking these medications    acetaminophen 325 MG tablet  Commonly known as:  TYLENOL  Take 2 tablets (650 mg total) by mouth every six (6) hours as needed.     levothyroxine 25 MCG tablet  Commonly known as:  SYNTHROID, LEVOTHROID  Take 1 tablet (25 mcg total) by mouth daily.     mirtazapine 45 MG tablet  Commonly known as:  REMERON  Take 1 tablet (45 mg total) by mouth nightly.     ondansetron 4 MG disintegrating tablet  Commonly known as:  ZOFRAN-ODT  Take 1 tablet (4 mg total) by mouth every eight (8) hours as needed for nausea.     polyethylene glycol 17 gram packet  Commonly known as:  MIRALAX  Take 17 g by mouth daily as needed.     traMADol 50 mg tablet  Commonly known as:  ULTRAM  Take 0.5 tablets (25 mg total) by mouth every eight (8) hours as needed for pain. for up to 5 days            Allergies:  Patient has no known allergies.  ______________________________________________________________________  Pending Test Results (if blank, then none):      Most Recent Labs:  All lab results last 24 hours -   Recent Results (from the past 24 hour(s))   Basic metabolic panel    Collection Time: 04/21/18  6:25 AM   Result Value Ref Range    Sodium 136 135 - 145 mmol/L    Potassium 3.5 3.5 - 5.0 mmol/L    Chloride 109 (H) 98 - 107 mmol/L    CO2 15.0 (L) 22.0 - 30.0 mmol/L    Anion Gap 12 9 - 15 mmol/L    BUN 33 (H) 7 - 21 mg/dL    Creatinine 1.61 (H) 0.60 - 1.00 mg/dL    BUN/Creatinine Ratio 20     EGFR CKD-EPI Non-African American, Female 31 (L) >=60 mL/min/1.44m2    EGFR CKD-EPI African American, Female 36 (L) >=60 mL/min/1.38m2    Glucose 66 65 - 179 mg/dL    Calcium 8.3 (L) 8.5 - 10.2 mg/dL   CBC w/ Differential    Collection Time: 04/21/18  6:25 AM   Result Value Ref Range    WBC 1.2 (L) 4.5 - 11.0 10*9/L    RBC 2.68 (L) 4.00 - 5.20 10*12/L    HGB 8.6 (L) 12.0 - 16.0 g/dL    HCT 09.6 (L) 04.5 - 46.0 %    MCV 103.3 (H) 80.0 - 100.0 fL    MCH 32.0 26.0 - 34.0 pg    MCHC 31.0 31.0 - 37.0 g/dL    RDW 40.9 (H) 81.1 - 15.0 %    MPV 7.8 7.0 - 10.0 fL    Platelet 7 (LL) 150 - 440 10*9/L    Neutrophil Left Shift 1+ (A) Not Present    Macrocytosis Marked (A) Not Present    Anisocytosis Moderate (A) Not Present    Hypochromasia Marked (A) Not Present   Manual Differential    Collection Time: 04/21/18  6:25 AM   Result Value Ref Range    Neutrophils % 70 %    Lymphocytes % 23 %    Monocytes % 7 %    Eosinophils % 0 %    Basophils % 0 %    Absolute Neutrophils 0.8 (L) 2.0 - 7.5 10*9/L    Absolute Lymphocytes 0.3 (L) 1.5 - 5.0 10*9/L    Absolute Monocytes 0.1 (L) 0.2 - 0.8 10*9/L    Absolute Eosinophils 0.0 0.0 - 0.4 10*9/L    Absolute Basophils 0.0  0.0 - 0.1 10*9/L    Smear Review Comments See Comment (A) Undefined    Toxic Granulation Present (A) Not Present   Prepare Platelet Pheresis    Collection Time: 04/21/18 11:43 AM   Result Value Ref Range    Unit Blood Type A Pos     ISBT Number 6200     Unit # [id number]     Status Issued     Product ID Platelets     PRODUCT CODE [id number]        Relevant Studies/Radiology (if blank, then none):  Xr Chest 2 Views    Result Date: 04/15/2018  EXAM: CHEST TWO VIEW DATE: 04/15/2018 4:18 PM ACCESSION: [id number] UN DICTATED: 04/15/2018 4:24 PM INTERPRETATION LOCATION: Main Campus CLINICAL INDICATION: 77 years old Female with SHORTNESS OF BREATH  COMPARISON: 04/27/2016 TECHNIQUE:PA/Upright and lateral views of the chest. FINDINGS: Left apical consolidative opacity is concerning for pneumonia. Diffuse hazy pulmonary opacities. No pneumothorax or pleural effusion. Tortuous thoracic aorta, aortic knob is obscured by left upper lobe consolidated opacity. L1 vertebral body collapse is chronic. Osteopenia.     - Left apical consolidative opacity is concerning for pneumonia. Recommend continued radiologic follow-up to clearance. Diffuse increased pulmonary opacities, likely background pulmonary edema although multifocal infection is in the differential.    ______________________________________________________________________  Discharge Instructions:               Follow Up instructions and Outpatient Referrals     Call MD for:  difficulty breathing, headache or visual disturbances      Call MD for:  persistent dizziness or light-headedness      Call MD for: Temperature > 38.5 Celsius ( > 101.3 Fahrenheit)      Discharge instructions      I certify that based on my evaluation of this patient, this patient requires BLS transportation services and that other forms of transport are contraindicated.  Please refer to care management transitions note for transportation details.         Discharge instructions      Kayla Torres presented to [facility] with weakness, fatigue, and lack of oral intake. She was found to have an upper lobe pneumonia requiring treatment with antibiotics for 5 days. We had goals of care conversation with her and her family.     Please make sure you have a functioning thermometer at home.?? If you are feeling poorly, especially if you have chills, shaking, muscle aches or lightheadedness, measure your temperature. If it is more than 100.5 Farenheit, call the nurse triage line during daytime hours (Monday through Friday 8AM-5PM: [phone]) or on nights and weekends, the on-call doctor by calling the hospital operator [phone]) and asking for the on-call adult oncologist. Alternatively, since fever after chemotherapy may be a medical emergency, you may proceed directly to your local emergency room. Inform your provider that you recently received chemotherapy. You may have blood drawn for blood cultures and receive IV antibiotics.    Following discharge from the hospital if you notice the development or worsening of any symptoms such as nausea, vomiting, chest pain, shortness of breath, fevers, or chills, please return to the emergency department.??     If you develop these symptoms, or if you have trouble obtaining any of your medications you may call the [facility] Communication Center to speak with the triage team at [phone] if Monday through Friday 8am-5pm or call [phone] after hours.      For appointments & questions Monday through Friday 8 AM- 5  PM   please call [phone] or Toll free [phone].    On Nights, Weekends and Holidays  Call [phone] and ask for the oncologist on call.    [facility]  [address]  [place], [place] [address]  [email or web address]               Appointments which have been scheduled for you    Apr 28, 2018  8:45 AM EDT  (Arrive by 8:15 AM)  LAB ONLY [place] with ADULT ONC LAB  [facility] ADULT ONCOLOGY LAB DRAW STATION [place] [facility] REGION) [address]  [place] [address]  [phone]      Apr 28, 2018 10:00 AM EDT  (Arrive by 9:30 AM)  RETURN ACTIVE [place] with Vernie Murders, AGNP  [place] HEMATOLOGY ONCOLOGY 2ND FLR CANCER HOSP [facility] REGION) [address]  [place] [address]  [phone]      Apr 28, 2018 11:30 AM EDT  (Arrive by 11:00 AM)  BLOOD TRANSFUSION - 2 UNITS with ONCINF CHAIR 46  [place] ONCOLOGY INFUSION [facility] REGION) [address]  [place] [address]  [phone]      May 05, 2018  9:30 AM EDT  (Arrive by 9:00 AM)  LAB ONLY [place] with ADULT ONC LAB  [facility] ADULT ONCOLOGY LAB DRAW STATION [place] [facility] REGION) [address]  [place] [address]  [phone]      May 05, 2018 10:30 AM EDT  (Arrive by 10:00 AM)  RETURN ACTIVE [place] with Vernie Murders, AGNP  [place] HEMATOLOGY ONCOLOGY 2ND FLR CANCER HOSP [facility] REGION) [address]  [place] [address]  [phone]      Oct 18, 2018 12:00 PM EST  (Arrive by 11:45 AM)  RETURN  RHEUMATOLOGY with Rudolpho Sevin, MD  [facility] RHEUMATOLOGY [address] [place] [facility] REGION) [address] [place] [address]  [phone]           ______________________________________________________________________  Discharge Day Services:  BP 140/82  - Pulse 70  - Temp 36.7 ??C (Oral)  - Resp 18  - Ht 170.2 cm (5' 7.01)  - Wt 58.4 kg (128 lb 12 oz)  - SpO2 100%  - BMI 20.16 kg/m??   Pt seen on the day of discharge and determined appropriate for discharge.    Condition at Discharge: good    Length of Discharge: I spent greater than 30 mins in the discharge of this patient.

## 2018-04-21 NOTE — Unmapped (Signed)
Daily Progress Note    Interval History/Subjective:   NAEON. BM x 2. Urinating. Working to encourage PO intake. Awaiting SNF placement as part of transition to hospice in the setting of no more options for her myeloma treatment.        Assessment/Plan:    Kayla Torres is a 77 y.o. female with a history of IgG kappa multiple myeloma (diagnosed 12/2013, now s/p multiple regimens including present melphalan and prednisone) who presented to [facility] on 04/15/2018 with weakness and weight loss, found to have LUL consolidation concerning for CAP and she was started on Ceftrixone/Azithromycin. Her weakness and muscle wasting is significant in her lower extremities. Her history of chemotherapy, current presumed CAP, and MM could all contribute to lack of po intake that may be responsible for her sarcopenia. Other causes such as vitamin deficiency or myopathy have been ruled out based on labs. Per 04/18/18 conversations with the patient and her daughter, the patient is now DNR/DNI and interested in looking at [facility] w/ hospice transition.      Hypertension      Multiple myeloma in relapse (CMS-HCC)      CKD (chronic kidney disease) stage 3, GFR 30-59 ml/min (CMS-HCC)      Pancytopenia (CMS-HCC)      IgG kappa multiple myeloma: Follows with Synetta Fail and Dr. Anise Salvo in Myeloma clinic. Initially diagnosed in 12/2013 in the setting of worsening back pain. She has been treated with CyborD, bortezomib maintenance, carfilzomib/dex/revlimid, single agent daratumumab, and Benda/Pom/Dex . Most recent treatment with ixazomib/melphalan/prednisone but experienced significant pancytopenias with Ixazomib and current plan is to proceed with melphalan and prednisone alone.   - Total IgG is 4,018 from 4,363 (06/5), IgM <25, IgA 10.9  - Continue prednisone 20mg  daily   - Continue calcium and vitamin D supplementation   - Continue valacyclovir 500mg  daily for VZV PPx   - cell counts continue to drop   [ ]  f/u 04/18/18 Light chains, urine, MM work up Left Apical Pneumonia: S/p empiric coverage with vancomycin and cefepime in the ED. Given normal ANC and relative lack of risk factors for exposure to MDR organisms, suspect CAP.    - IV ceftriaxone x 5 days (6/28 - )  - PO azithromycin x 5 days (6/28 - 7/2)  - 6/28 BCx x 2 - NGTD  - bedside US on 06/29 confirmed upper lobe consolidation   - If clinically worsens, broaden antibiotics to include MDR organisms    Stage III CKD: Creatinine widely variable in the past year, but average baseline appears to be ~1.5. On presentation, renal function at baseline with Cr. 1.3.   - 04/19/18 Cr 1.77, up from 1.5 on 04/18/18.  - IVF?  - Daily BUN/Cr  - Avoid nephrotoxic agents as able     Foot Pain: most likely from hx of PVD   -Tramadol prn for pain   - Gabapentin  - PT/OT     Sarcopenia: muscle wasting of her lower extremities is significant coupled with muscle weakness. Unsure of what could cause this muscle wasting and weakness picture. DDX includes vitamin deficiency, myopathy, malnutrition, deconditioning, hormone imbalance, ALS, age, polymyositis.    - encourage po intake  - PT/OT  -TSH of 1.079 and on Levo  - Na and K wnl, would expect abnormal in adrenal abnormality  - B12, homocystinuria, CK within normal limits  - f/u aldolase  - d/c Drobinol 2.5 mg bid  - decreased Prednisone to 10 mg daily x 2 days then  taper to 5 mg x 2 days  ??  Elevated BNP: BNP of 3,270 and and EKG showing sinus tachycardia with premature atrial beats on 06/27. Could contribute to muscle wasting. - TTE cancelled ordered in line with 04/18/18 GOC convo    Elevated D-dimer: D-dimer elevated at 12,000, obtained in the ED 2/2 tachycardia, underlying malignancy, and inability to obtain CTA due to underlying CKD, although no complaints of dyspnea and no hypoxemia. Given asymptomatic and normal troponin, low suspicion for PE at this time so will defer initiation of anticoagulation.   - PVL lower extremities  - Low threshold to start anticoagulation of clinically decompensates   ??  ??  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 Currently without knee pain, edema or erythema in joints.  - Continue prednisone as above   - d/c colchicine  ??  HTN: Holding metoprolol in setting of infection     Hypothyroidism: Recently started on synthroid daily, will continue     FEN: Regular diet with supplements  GI: Not indicated  VTE PPx: SCDs  Code Status: DNR and DNI  Dispo: E1 floor; likely d/c to SNF w/ hospice referrals given lack of further treatment options for multiple myeloma  ___________________________________________________    Subjective:  No acute events overnight    Labs/Studies:  Labs and Studies from the last 24hrs per EMR and Reviewed    Pressure Ulcer(s)    Active Pressure Ulcer     None                Malnutrition Evaluation as performed by RD, LDN: Severe Protein-Calorie Malnutrition in the context of chronic illness (04/18/18 1258)    Objective:  BP 147/77  - Pulse 97  - Temp 36.6 ??C (Oral)  - Resp 18  - Ht 170.2 cm (5' 7.01)  - Wt 58.4 kg (128 lb 12 oz)  - SpO2 100%  - BMI 20.16 kg/m??     General Appearrance: Alert and oriented x3, NAD, thin, sitting in chair by window  HEENT: EOMI, PERRL, anicteric.  Heart: RRR, no murmurs rubs or gallops, normal S1 and S2.  Lungs: CTAB, no increased WOB, no crackles wheezing or rales.  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   Psych: Normal affect, normal speech.   Skin: Intact with no lesions, no erythema, no rash.

## 2018-04-22 MED ORDER — VALACYCLOVIR 500 MG TABLET
ORAL_TABLET | ORAL | 11 refills | 0.00000 days | Status: CP
Start: 2018-04-22 — End: 2019-04-22

## 2018-04-28 NOTE — Unmapped (Signed)
Called by Dr. Daneil Dolin regarding request for OSH transfer for new fever.    Brief HPI:  Lovely 77yo woman with terrible myeloma that is end-stage without remaining treatment options.  Most recently ECOG PS 4 and was hospitalized with pneumonia.  She transitioned to SNF on 7/3 with a plan to pursue hospice care, but that is seemingly not yet arranged.      Pt now presents to OSH ED from SNF with fever to 103 F.      Assessment/Plan:  I called Ms. Gerety daughter, Collene Gobble at both work [phone]) and mobile [phone]).  I was routed to voice mail in both instances.  I also called a previously listed number [phone], but there was no answer and no voice mail.     I spoke to a representative from [facility] at [phone] [facility] Manger if further information needed - this was not who I spoke to now).  Misty Stanley has been at [facility] and is en-route to see her mother at the local ED (about 20 minutes away).  Per the [facility] representative, Ms. Hickson was a FULL CODE and was planned for a short stay in the SNF to transition home - no hospice discussions had been initiated.      From a medical standpoint, Ms. Rapley has progressive multiple myeloma and significant performance decline.  She has failed standard therapies and is not a suitable candidate for ongoing experimental options.  I recommend hospice care.      Rozetta Nunnery, MD  Hematology/Oncology

## 2018-04-28 NOTE — Unmapped (Signed)
Transfer Center Request Note    Date and Time: April 28, 2018 1:41 PM    Requesting Physician: Dr. Timmothy Euler    Requesting Hospital: [facility]    Requesting Service: ED    Reason for Transfer: Continuity of Care    Patient been to [facility] before? Yes    Brief Hospital Course:   Ms. Golomb is a 77yo F with multiple myeloma and CKD who was admitted 04/15/18-04/21/18 for pneumonia and failure to thrive. There were several goals of care discussions throughout admission with Ms. Dishman and her daughter and decision was made to discharge to SNF with likely plan to transition to hospice thereafter. Patient was not formally enrolled in hospice although she was made DNR/DNI.    Today she was brought to the ED due to fever to 103. She was again pancytopenic with no e/o infection on CXR or UA. She was started on broad-spectrum antibiotics. Initially the patient was evaluated by the hospitalist and deemed inappropriate for admission at the local hospital due to complex medical history and fact that she was not yet on hospice. Transfer request was initiated. After the son arrived at bedside, however, he was able to talk to the hospitalist and they agreed that Ms. Speirs's goals of care are consistent with transition to comfort measures and pursuit of hospice care. Daughter is en-route. Transfer request has been canceled. If the daughter feels differently about optimal plan of care, OSH provider will reach out again.    Fellow Triaging Request:  Lance Coon

## 2018-05-20 DEATH — deceased

## 2018-07-12 NOTE — Unmapped (Signed)
Opened in error

## 2018-11-26 NOTE — Unmapped (Signed)
Unable to reach patient after multiple attempts
